# Patient Record
Sex: Female | Born: 1985 | Race: White | Hispanic: No | Marital: Married | Smoking: Never smoker
Health system: Southern US, Community
[De-identification: ages and names within clinical notes are randomized; demographics above are authoritative.]

## PROBLEM LIST (undated history)

## (undated) DIAGNOSIS — D649 Anemia, unspecified: Secondary | ICD-10-CM

## (undated) DIAGNOSIS — K219 Gastro-esophageal reflux disease without esophagitis: Secondary | ICD-10-CM

## (undated) DIAGNOSIS — T8859XA Other complications of anesthesia, initial encounter: Secondary | ICD-10-CM

## (undated) DIAGNOSIS — R569 Unspecified convulsions: Secondary | ICD-10-CM

## (undated) DIAGNOSIS — Z8489 Family history of other specified conditions: Secondary | ICD-10-CM

## (undated) DIAGNOSIS — T4145XA Adverse effect of unspecified anesthetic, initial encounter: Secondary | ICD-10-CM

## (undated) DIAGNOSIS — Z8614 Personal history of Methicillin resistant Staphylococcus aureus infection: Secondary | ICD-10-CM

## (undated) DIAGNOSIS — Z9889 Other specified postprocedural states: Secondary | ICD-10-CM

## (undated) DIAGNOSIS — R112 Nausea with vomiting, unspecified: Secondary | ICD-10-CM

## (undated) DIAGNOSIS — N946 Dysmenorrhea, unspecified: Secondary | ICD-10-CM

## (undated) DIAGNOSIS — M1990 Unspecified osteoarthritis, unspecified site: Secondary | ICD-10-CM

## (undated) HISTORY — PX: REDUCTION MAMMAPLASTY: SUR839

## (undated) HISTORY — PX: BREAST SURGERY: SHX581

## (undated) HISTORY — PX: ABLATION: SHX5711

## (undated) HISTORY — PX: UPPER GI ENDOSCOPY: SHX6162

## (undated) HISTORY — PX: TUBAL LIGATION: SHX77

## (undated) HISTORY — DX: Unspecified osteoarthritis, unspecified site: M19.90

## (undated) HISTORY — DX: Dysmenorrhea, unspecified: N94.6

## (undated) HISTORY — PX: COLONOSCOPY: SHX174

## (undated) HISTORY — PX: SHOULDER SURGERY: SHX246

## (undated) HISTORY — PX: ABDOMINAL HYSTERECTOMY: SHX81

---
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
## 2009-12-01 HISTORY — PX: REDUCTION MAMMAPLASTY: SUR839

## 2013-12-01 HISTORY — PX: TUBAL LIGATION: SHX77

## 2017-08-30 DIAGNOSIS — S43102A Unspecified dislocation of left acromioclavicular joint, initial encounter: Secondary | ICD-10-CM | POA: Insufficient documentation

## 2017-12-01 HISTORY — PX: ABDOMINAL HYSTERECTOMY: SHX81

## 2017-12-01 HISTORY — PX: LAPAROSCOPIC TOTAL HYSTERECTOMY: SUR800

## 2017-12-05 ENCOUNTER — Ambulatory Visit
Admission: EM | Admit: 2017-12-05 | Discharge: 2017-12-05 | Disposition: A | Discharge status: Home / Self Care | Payer: BLUE CROSS/BLUE SHIELD | Attending: Emergency Medicine | Admitting: Emergency Medicine

## 2017-12-05 ENCOUNTER — Encounter: Payer: Self-pay | Admitting: Emergency Medicine

## 2017-12-05 ENCOUNTER — Other Ambulatory Visit: Payer: Self-pay

## 2017-12-05 DIAGNOSIS — J0140 Acute pansinusitis, unspecified: Secondary | ICD-10-CM | POA: Diagnosis not present

## 2017-12-05 MED ORDER — METHYLPREDNISOLONE 4 MG PO TBPK: ORAL_TABLET | ORAL | 0 refills | Status: DC

## 2017-12-05 MED ORDER — NAPROXEN 500 MG PO TABS: 500.0000 mg | ORAL_TABLET | Freq: Two times a day (BID) | ORAL | 0 refills | Status: DC

## 2017-12-05 MED ORDER — FLUTICASONE PROPIONATE 50 MCG/ACT NA SUSP: 2.0000 | Freq: Every day | NASAL | 0 refills | Status: DC

## 2017-12-05 MED ORDER — HYDROCODONE-ACETAMINOPHEN 5-325 MG PO TABS: 1.0000 | ORAL_TABLET | Freq: Four times a day (QID) | ORAL | 0 refills | Status: DC | PRN

## 2017-12-05 NOTE — ED Triage Notes (Signed)
Patient c/o sinus pain and pressure and right sided jaw pain for a week.  Patient currently on Augmentin and started it last night.

## 2017-12-05 NOTE — ED Provider Notes (Signed)
HPI  SUBJECTIVE:  Melanie Choi is a 32 y.o. female who presents with sharp, waxing and waning right upper jaw pain with occasional radiation to the lower jaw for the past 2 days.  She reports feeling feverish, but does not have a thermometer at home.  She reports sinus pain and pressure, postnasal drip.  She states that her upper teeth hurt on the right side.  She also reports right ear pain.  She was seen at another urgent care yesterday, thought to have a sinus infection, started on Augmentin.  She has had 2 doses of the Augmentin thus far.  She has also been taking 2 OTC Aleve's.  Last dose was within 6-8 hours of evaluation.  This is not helping.  Symptoms are worse with chewing, noise.  No nasal congestion, rhinorrhea, trauma to her teeth, caries, cracked tooth.  She denies change in hearing, otorrhea, trismus, neck stiffness.  No popping or clicking of her jaw.  She does grind her teeth.  No history of TMJ athralgia, diabetes, hypertension, otitis media.  LMP: 2 weeks ago.  Denies possibility being pregnant.  PMD: she is establishing care with [facility].    History reviewed. No pertinent past medical history.  Past Surgical History:  Procedure Laterality Date  . ABLATION    . BREAST SURGERY    . CESAREAN SECTION    . SHOULDER SURGERY Right   . TUBAL LIGATION      Family History  Problem Relation Age of Onset  . Thyroid disease Mother   . Heart attack Father     Social History   Tobacco Use  . Smoking status: Never Smoker  . Smokeless tobacco: Never Used  Substance Use Topics  . Alcohol use: No    Frequency: Never  . Drug use: Not on file    No current facility-administered medications for this encounter.   Current Outpatient Medications:  .  amoxicillin-clavulanate (AUGMENTIN) 875-125 MG tablet, Take 1 tablet by mouth 2 (two) times daily., Disp: , Rfl:  .  fluticasone (FLONASE) 50 MCG/ACT nasal spray, Place 2 sprays into both nostrils daily., Disp: 16 g, Rfl:  0 .  HYDROcodone-acetaminophen (NORCO/VICODIN) 5-325 MG tablet, Take 1-2 tablets by mouth every 6 (six) hours as needed for moderate pain or severe pain., Disp: 20 tablet, Rfl: 0 .  methylPREDNISolone (MEDROL DOSEPAK) 4 MG TBPK tablet, Follow package instructions, Disp: 21 tablet, Rfl: 0 .  naproxen (NAPROSYN) 500 MG tablet, Take 1 tablet (500 mg total) by mouth 2 (two) times daily., Disp: 20 tablet, Rfl: 0  Allergies  Allergen Reactions  . Morphine And Related Anaphylaxis     ROS  As noted in HPI.   Physical Exam  BP 122/81 (BP Location: Left Arm)   Pulse 89   Temp 98.5 F (36.9 C) (Oral)   Resp 14   Ht 5\' 5"  (1.651 m)   Wt 177 lb (80.3 kg)   LMP 11/14/2017 (Approximate)   SpO2 100%   BMI 29.45 kg/m   Constitutional: Well developed, well nourished, appears uncomfortable.   Eyes:  EOMI, conjunctiva normal bilaterally HENT: Normocephalic, atraumatic,mucus membranes moist.  Positive clear rhinorrhea with erythematous, swollen turbinates.  Positive maxillary and frontal sinus tenderness, exquisite tenderness over the right maxillary sinus.  Right external ear normal.  No pain with traction on the pinna or palpation of tragus.  No mastoid tenderness.  TM normal.  Mild tenderness over the right TMJ, no crepitus.  Left TM normal.  Normal dentition, teeth  nontender upper and lower jaw.  No gingival swelling or tenderness.  Oropharynx unremarkable. Respiratory: Normal inspiratory effort Cardiovascular: Normal rate GI: nondistended skin: No rash, skin intact Musculoskeletal: no deformities Neurologic: Alert & oriented x 3, no focal neuro deficits Psychiatric: Speech and behavior appropriate   ED Course   Medications - No data to display  No orders of the defined types were placed in this encounter.   No results found for this or any previous visit (from the past 24 hour(s)). No results found.  ED Clinical Impression  Acute non-recurrent pansinusitis   ED  Assessment/Plan  [place] Narcotic database reviewed for this patient, and feel that the risk/benefit ratio today is favorable for proceeding with a prescription for controlled substance.  Presentation most consistent with a sinusitis although TMJ arthralgia is in the differential because she does grind her teeth..  She has only had 2 doses of the Augmentin, so advised her to continue this for another 48 hours a for calling this antibiotic treatment failure. Will start her on Flonase, Medrol Dosepak, Naprosyn 500 mg twice a day to take with 1 g of Tylenol.  Advised her that she may take an additional gram of Tylenol 2 more times a day.  We will sent home with Norco for severe pain  only.  Discussed with her to not take the Norco and the Tylenol together.  She will return here in 48 hours if not better, she will go to the ER if she gets worse.  Patient states she is allergic to morphine IV only, that she has tolerated narcotic pain pills previously without any problems.  Discussed  MDM, plan and followup with patient. Discussed sn/sx that should prompt return to the ED. patient agrees with plan.   Meds ordered this encounter  Medications  . fluticasone (FLONASE) 50 MCG/ACT nasal spray    Sig: Place 2 sprays into both nostrils daily.    Dispense:  16 g    Refill:  0  . methylPREDNISolone (MEDROL DOSEPAK) 4 MG TBPK tablet    Sig: Follow package instructions    Dispense:  21 tablet    Refill:  0  . HYDROcodone-acetaminophen (NORCO/VICODIN) 5-325 MG tablet    Sig: Take 1-2 tablets by mouth every 6 (six) hours as needed for moderate pain or severe pain.    Dispense:  20 tablet    Refill:  0  . naproxen (NAPROSYN) 500 MG tablet    Sig: Take 1 tablet (500 mg total) by mouth 2 (two) times daily.    Dispense:  20 tablet    Refill:  0    *This clinic note was created using [employer]. Therefore, there may be occasional mistakes despite careful proofreading.   ?   Melynda Ripple,  MD 12/05/17 1109

## 2017-12-10 ENCOUNTER — Encounter: Payer: Self-pay | Admitting: Obstetrics and Gynecology

## 2017-12-10 ENCOUNTER — Ambulatory Visit (INDEPENDENT_AMBULATORY_CARE_PROVIDER_SITE_OTHER): Payer: BLUE CROSS/BLUE SHIELD | Admitting: Obstetrics and Gynecology

## 2017-12-10 VITALS — BP 111/75 | HR 80 | Ht 65.0 in | Wt 175.1 lb

## 2017-12-10 DIAGNOSIS — N9089 Other specified noninflammatory disorders of vulva and perineum: Secondary | ICD-10-CM | POA: Diagnosis not present

## 2017-12-10 DIAGNOSIS — N920 Excessive and frequent menstruation with regular cycle: Secondary | ICD-10-CM

## 2017-12-10 MED ORDER — CLOBETASOL PROPIONATE 0.05 % EX CREA: 1.0000 "application " | TOPICAL_CREAM | Freq: Two times a day (BID) | CUTANEOUS | 2 refills | Status: AC

## 2017-12-10 NOTE — Progress Notes (Addendum)
HPI:      [name] is a 32 y.o. [id number] who LMP was Patient's last menstrual period was 11/17/2017.  Subjective:   She presents today because she has just moved to this area and would like to establish GYN care.  She has had 2 prior cesarean deliveries and a tubal ligation.  She has been experiencing extremely heavy menstrual bleeding for her entire life.  She states her periods last 10-14 days and every day is heavy.  She previously tried OCPs without success.  She underwent endometrial ablation which she says "did not work".  She continues to experience very heavy prolonged menses.  She states her last GYN felt she had adenomyosis and had suggested hysterectomy when ablation failed.  Patient is interested in scheduling a hysterectomy. Additionally she complains of intermittent labial tears which occurs spontaneously.  Significant burning especially with urination at this labial skin area.  She denies history of HSV or any previous issue with blisters.    Hx: The following portions of the patient's history were reviewed and updated as appropriate:             She  has a past medical history of Arthritis and Dysmenorrhea. She does not have a problem list on file. She  has a past surgical history that includes Tubal ligation; Cesarean section; Ablation; Shoulder surgery (Right); and Breast surgery. Her family history includes Heart attack in her father; Thyroid disease in her mother. She  reports that  has never smoked. she has never used smokeless tobacco. She reports that she does not drink alcohol or use drugs. She is allergic to morphine and related.       Review of Systems:  Review of Systems  Constitutional: Denied constitutional symptoms, night sweats, recent illness, fatigue, fever, insomnia and weight loss.  Eyes: Denied eye symptoms, eye pain, photophobia, vision change and visual disturbance.  Ears/Nose/Throat/Neck: Denied ear, nose, throat or neck symptoms, hearing loss,  nasal discharge, sinus congestion and sore throat.  Cardiovascular: Denied cardiovascular symptoms, arrhythmia, chest pain/pressure, edema, exercise intolerance, orthopnea and palpitations.  Respiratory: Denied pulmonary symptoms, asthma, pleuritic pain, productive sputum, cough, dyspnea and wheezing.  Gastrointestinal: Denied, gastro-esophageal reflux, melena, nausea and vomiting.  Genitourinary: See HPI for additional information.  Musculoskeletal: Denied musculoskeletal symptoms, stiffness, swelling, muscle weakness and myalgia.  Dermatologic: Denied dermatology symptoms, rash and scar.  Neurologic: Denied neurology symptoms, dizziness, headache, neck pain and syncope.  Psychiatric: Denied psychiatric symptoms, anxiety and depression.  Endocrine: Denied endocrine symptoms including hot flashes and night sweats.   Meds:   Current Outpatient Medications on File Prior to Visit  Medication Sig Dispense Refill  . amoxicillin-clavulanate (AUGMENTIN) 875-125 MG tablet Take 1 tablet by mouth 2 (two) times daily.    . fluticasone (FLONASE) 50 MCG/ACT nasal spray Place 2 sprays into both nostrils daily. 16 g 0  . HYDROcodone-acetaminophen (NORCO/VICODIN) 5-325 MG tablet Take 1-2 tablets by mouth every 6 (six) hours as needed for moderate pain or severe pain. 20 tablet 0  . naproxen (NAPROSYN) 500 MG tablet Take 1 tablet (500 mg total) by mouth 2 (two) times daily. 20 tablet 0   No current facility-administered medications on file prior to visit.     Objective:     Vitals:   12/10/17 1106  BP: 111/75  Pulse: 80              Physical examination   Pelvic:   Vulva:  Linear tear along the labial majora left.  Some agglutination of fold noted.  Similar appearance to the right although not currently torn.  Appearance of possible localized lichen sclerosis.  Vagina: No lesions or abnormalities noted.  Support: Normal pelvic support.  Urethra No masses tenderness or scarring.  Meatus Normal  size without lesions or prolapse.  Cervix: Normal appearance.  No lesions.  Anus: Normal exam.  No lesions.  Perineum: Normal exam.  No lesions.        Bimanual   Uterus:  Top normal.  Non-tender.  Mobile.  AV.  Adnexae: No masses.  Non-tender to palpation.  Cul-de-sac: Negative for abnormality.     Assessment:    G2P2002 There are no active problems to display for this patient.    1. Vulvar lesion   2. Menorrhagia with regular cycle     Possible lichen sclerosis causing vulvar tearing  Adenomyosis and possible endometriosis seem likely.  Patient desires definitive surgery.   Plan:            1.  Clobetasol to vulva as directed.  2.  Patient to schedule preop appointment when she has chosen a date for surgery.  We have discussed different forms of surgery including LAVH and TAH.  Oophorectomy has also been briefly discussed. Orders No orders of the defined types were placed in this encounter.    Meds ordered this encounter  Medications  . clobetasol cream (TEMOVATE) 0.05 %    Sig: Apply 1 application topically 2 (two) times daily. Use for 30 days twice a day as directed then use daily as directed.    Dispense:  60 g    Refill:  2      F/U  No Follow-up on file.  Finis Bud, M.D. 12/10/2017 12:14 PM

## 2018-01-12 DIAGNOSIS — E782 Mixed hyperlipidemia: Secondary | ICD-10-CM | POA: Insufficient documentation

## 2018-01-12 DIAGNOSIS — M546 Pain in thoracic spine: Secondary | ICD-10-CM | POA: Insufficient documentation

## 2018-01-12 DIAGNOSIS — E7800 Pure hypercholesterolemia, unspecified: Secondary | ICD-10-CM | POA: Insufficient documentation

## 2018-01-12 DIAGNOSIS — Z8711 Personal history of peptic ulcer disease: Secondary | ICD-10-CM | POA: Insufficient documentation

## 2018-01-12 DIAGNOSIS — G8929 Other chronic pain: Secondary | ICD-10-CM | POA: Insufficient documentation

## 2018-01-12 DIAGNOSIS — K219 Gastro-esophageal reflux disease without esophagitis: Secondary | ICD-10-CM | POA: Insufficient documentation

## 2018-01-12 DIAGNOSIS — M545 Low back pain, unspecified: Secondary | ICD-10-CM | POA: Insufficient documentation

## 2018-01-26 ENCOUNTER — Encounter: Payer: Self-pay | Admitting: Obstetrics and Gynecology

## 2018-01-26 ENCOUNTER — Ambulatory Visit (INDEPENDENT_AMBULATORY_CARE_PROVIDER_SITE_OTHER): Payer: BLUE CROSS/BLUE SHIELD | Admitting: Obstetrics and Gynecology

## 2018-01-26 VITALS — BP 138/85 | HR 83 | Ht 65.0 in | Wt 171.2 lb

## 2018-01-26 DIAGNOSIS — N920 Excessive and frequent menstruation with regular cycle: Secondary | ICD-10-CM

## 2018-01-26 DIAGNOSIS — Z01818 Encounter for other preprocedural examination: Secondary | ICD-10-CM | POA: Diagnosis not present

## 2018-01-26 MED ORDER — METRONIDAZOLE 500 MG PO TABS: 500.0000 mg | ORAL_TABLET | Freq: Two times a day (BID) | ORAL | 0 refills | Status: DC

## 2018-01-26 NOTE — H&P (Signed)
PRE-OPERATIVE HISTORY AND PHYSICAL EXAM  PCP:  Shari Prows, [facility] Subjective:   HPI:  Melanie Choi is a 32 y.o. [id number].  Patient's last menstrual period was 01/05/2018.  She presents today for a pre-op discussion and PE.  She has the following symptoms: Heavy menstrual bleeding with cramps.  Previously underwent endometrial ablation which was unsuccessful.  The patient also experiences pelvic pain not associated with menses. History is significant for prior cesarean delivery.  Review of Systems:   Constitutional: Denied constitutional symptoms, night sweats, recent illness, fatigue, fever, insomnia and weight loss.  Eyes: Denied eye symptoms, eye pain, photophobia, vision change and visual disturbance.  Ears/Nose/Throat/Neck: Denied ear, nose, throat or neck symptoms, hearing loss, nasal discharge, sinus congestion and sore throat.  Cardiovascular: Denied cardiovascular symptoms, arrhythmia, chest pain/pressure, edema, exercise intolerance, orthopnea and palpitations.  Respiratory: Denied pulmonary symptoms, asthma, pleuritic pain, productive sputum, cough, dyspnea and wheezing.  Gastrointestinal: Denied, gastro-esophageal reflux, melena, nausea and vomiting.  Genitourinary: See HPI for additional information.  Musculoskeletal: Denied musculoskeletal symptoms, stiffness, swelling, muscle weakness and myalgia.  Dermatologic: Denied dermatology symptoms, rash and scar.  Neurologic: Denied neurology symptoms, dizziness, headache, neck pain and syncope.  Psychiatric: Denied psychiatric symptoms, anxiety and depression.  Endocrine: Denied endocrine symptoms including hot flashes and night sweats.   OB History  Gravida Para Term Preterm AB Living  2 2 2     2   SAB TAB Ectopic Multiple Live Births          2    # Outcome Date GA Lbr Len/2nd Weight Sex Delivery Anes PTL Lv  2 Term 2015   6 lb (2.722 kg) F CS-Unspec  Y LIV  1 Term 2013   7 lb 14 oz (3.572 kg) M CS-Unspec  Y  LIV      Past Medical History:  Diagnosis Date  . Arthritis   . Dysmenorrhea     Past Surgical History:  Procedure Laterality Date  . ABLATION    . BREAST SURGERY    . CESAREAN SECTION    . SHOULDER SURGERY Right   . TUBAL LIGATION        SOCIAL HISTORY: Social History   Tobacco Use  Smoking Status Never Smoker  Smokeless Tobacco Never Used   Social History   Substance and Sexual Activity  Alcohol Use No  . Frequency: Never   Social History   Substance and Sexual Activity  Drug Use No    Family History  Problem Relation Age of Onset  . Thyroid disease Mother   . Heart attack Father     ALLERGIES:  Morphine and related  MEDS:   Current Outpatient Medications on File Prior to Visit  Medication Sig Dispense Refill  . naproxen (NAPROSYN) 500 MG tablet Take 1 tablet (500 mg total) by mouth 2 (two) times daily. 20 tablet 0  . fluticasone (FLONASE) 50 MCG/ACT nasal spray Place 2 sprays into both nostrils daily. (Patient not taking: Reported on 01/26/2018) 16 g 0   No current facility-administered medications on file prior to visit.     No orders of the defined types were placed in this encounter.    Physical examination BP 138/85   Pulse 83   Ht 5\' 5"  (1.651 m)   Wt 171 lb 3.2 oz (77.7 kg)   LMP 01/05/2018   BMI 28.49 kg/m   General NAD, Conversant  HEENT Atraumatic; Op clear with mmm.  Normo-cephalic. Pupils reactive. Anicteric sclerae  Thyroid/Neck Smooth without nodularity or enlargement. Normal ROM.  Neck Supple.  Skin No rashes, lesions or ulceration. Normal palpated skin turgor. No nodularity.  Breasts: No masses or discharge.  Symmetric.  No axillary adenopathy.  Previous breast reduction surgery scars present.  Lungs: Clear to auscultation.No rales or wheezes. Normal Respiratory effort, no retractions.  Heart: NSR.  No murmurs or rubs appreciated. No periferal edema  Abdomen: Soft.  Non-tender.  No masses.  No HSM. No hernia  Extremities:  Moves all appropriately.  Normal ROM for age. No lymphadenopathy.  Neuro: Oriented to PPT.  Normal mood. Normal affect.     Pelvic:   Vulva: Normal appearance.  No lesions.  Vagina: No lesions or abnormalities noted.  Support: Normal pelvic support.  Urethra No masses tenderness or scarring.  Meatus Normal size without lesions or prolapse.  Cervix: Normal ectropion.  No lesions.  Anus: Normal exam.  No lesions.  Perineum: Normal exam.  No lesions.        Bimanual   Uterus:  Approximately 8 weeks size.  Non-tender.  Mobile.  AV.  Adnexae: No masses.  Non-tender to palpation.  Cul-de-sac: Negative for abnormality.   Assessment:   G2P2002 There are no active problems to display for this patient.   1. Preop examination   2. Menorrhagia with regular cycle     Patient having significant menorrhagia.  Also experiences pelvic pain not associated with cycle.  Has occasional dyspareunia as well.  Has failed hormone therapy.  Has failed endometrial ablation.  We have discussed the possibility of adenomyosis and endometriosis in detail although she has not been previously diagnosed with this.   Patient has considered her options and would like definitive surgery.  Plan:   Orders: No orders of the defined types were placed in this encounter.    1.  laparoscopic assisted vaginal hysterectomy  Possible BSO

## 2018-01-26 NOTE — Progress Notes (Signed)
PRE-OPERATIVE HISTORY AND PHYSICAL EXAM  PCP:  Shari Prows, [facility] Subjective:   HPI:  Melanie Choi is a 32 y.o. [id number].  Patient's last menstrual period was 01/05/2018.  She presents today for a pre-op discussion and PE.  She has the following symptoms: Heavy menstrual bleeding with cramps.  Previously underwent endometrial ablation which was unsuccessful.  The patient also experiences pelvic pain not associated with menses. History is significant for prior cesarean delivery.  Review of Systems:   Constitutional: Denied constitutional symptoms, night sweats, recent illness, fatigue, fever, insomnia and weight loss.  Eyes: Denied eye symptoms, eye pain, photophobia, vision change and visual disturbance.  Ears/Nose/Throat/Neck: Denied ear, nose, throat or neck symptoms, hearing loss, nasal discharge, sinus congestion and sore throat.  Cardiovascular: Denied cardiovascular symptoms, arrhythmia, chest pain/pressure, edema, exercise intolerance, orthopnea and palpitations.  Respiratory: Denied pulmonary symptoms, asthma, pleuritic pain, productive sputum, cough, dyspnea and wheezing.  Gastrointestinal: Denied, gastro-esophageal reflux, melena, nausea and vomiting.  Genitourinary: See HPI for additional information.  Musculoskeletal: Denied musculoskeletal symptoms, stiffness, swelling, muscle weakness and myalgia.  Dermatologic: Denied dermatology symptoms, rash and scar.  Neurologic: Denied neurology symptoms, dizziness, headache, neck pain and syncope.  Psychiatric: Denied psychiatric symptoms, anxiety and depression.  Endocrine: Denied endocrine symptoms including hot flashes and night sweats.   OB History  Gravida Para Term Preterm AB Living  2 2 2     2   SAB TAB Ectopic Multiple Live Births          2    # Outcome Date GA Lbr Len/2nd Weight Sex Delivery Anes PTL Lv  2 Term 2015   6 lb (2.722 kg) F CS-Unspec  Y LIV  1 Term 2013   7 lb 14 oz (3.572 kg) M CS-Unspec  Y  LIV      Past Medical History:  Diagnosis Date  . Arthritis   . Dysmenorrhea     Past Surgical History:  Procedure Laterality Date  . ABLATION    . BREAST SURGERY    . CESAREAN SECTION    . SHOULDER SURGERY Right   . TUBAL LIGATION        SOCIAL HISTORY: Social History   Tobacco Use  Smoking Status Never Smoker  Smokeless Tobacco Never Used   Social History   Substance and Sexual Activity  Alcohol Use No  . Frequency: Never   Social History   Substance and Sexual Activity  Drug Use No    Family History  Problem Relation Age of Onset  . Thyroid disease Mother   . Heart attack Father     ALLERGIES:  Morphine and related  MEDS:   Current Outpatient Medications on File Prior to Visit  Medication Sig Dispense Refill  . naproxen (NAPROSYN) 500 MG tablet Take 1 tablet (500 mg total) by mouth 2 (two) times daily. 20 tablet 0  . fluticasone (FLONASE) 50 MCG/ACT nasal spray Place 2 sprays into both nostrils daily. (Patient not taking: Reported on 01/26/2018) 16 g 0   No current facility-administered medications on file prior to visit.     No orders of the defined types were placed in this encounter.    Physical examination BP 138/85   Pulse 83   Ht 5\' 5"  (1.651 m)   Wt 171 lb 3.2 oz (77.7 kg)   LMP 01/05/2018   BMI 28.49 kg/m   General NAD, Conversant  HEENT Atraumatic; Op clear with mmm.  Normo-cephalic. Pupils reactive. Anicteric sclerae  Thyroid/Neck Smooth without nodularity or enlargement. Normal ROM.  Neck Supple.  Skin No rashes, lesions or ulceration. Normal palpated skin turgor. No nodularity.  Breasts: No masses or discharge.  Symmetric.  No axillary adenopathy.  Previous breast reduction surgery scars present.  Lungs: Clear to auscultation.No rales or wheezes. Normal Respiratory effort, no retractions.  Heart: NSR.  No murmurs or rubs appreciated. No periferal edema  Abdomen: Soft.  Non-tender.  No masses.  No HSM. No hernia  Extremities:  Moves all appropriately.  Normal ROM for age. No lymphadenopathy.  Neuro: Oriented to PPT.  Normal mood. Normal affect.     Pelvic:   Vulva: Normal appearance.  No lesions.  Vagina: No lesions or abnormalities noted.  Support: Normal pelvic support.  Urethra No masses tenderness or scarring.  Meatus Normal size without lesions or prolapse.  Cervix: Normal ectropion.  No lesions.  Anus: Normal exam.  No lesions.  Perineum: Normal exam.  No lesions.        Bimanual   Uterus:  Approximately 8 weeks size.  Non-tender.  Mobile.  AV.  Adnexae: No masses.  Non-tender to palpation.  Cul-de-sac: Negative for abnormality.   Assessment:   G2P2002 There are no active problems to display for this patient.   1. Preop examination   2. Menorrhagia with regular cycle     Patient having significant menorrhagia.  Also experiences pelvic pain not associated with cycle.  Has occasional dyspareunia as well.  Has failed hormone therapy.  Has failed endometrial ablation.  We have discussed the possibility of adenomyosis and endometriosis in detail although she has not been previously diagnosed with this.   Patient has considered her options and would like definitive surgery.  Plan:   Orders: No orders of the defined types were placed in this encounter.    1.  laparoscopic assisted vaginal hysterectomy  Possible BSO  Pre-op discussions regarding Risks and Benefits of her scheduled surgery.  LAVH The procedure of Laparoscopic Assisted Vaginal Hysterectomy was described to the patient in detail.  We reviewed the rationale for Hysterectomy and the patient was again informed of other nonsurgical management possibilities for her condition.  She has considered these other options, and desires a Hysterectomy.  We have reviewed the fact that Hysterectomy is permanent and that following the procedure she will not be able to become pregnant or bear children.  We have discussed the following risk factors  specifically and the patient has also been informed that additional complications not mentioned may develop:  Damage to bowel, bladder, ureters or to other internal organs, bleeding, infection and the risk from anesthesia.  We have discussed the procedure itself in detail and she has an informed understanding of this surgery.  We have also discussed the recovery period in which physical and sexual activity will be restricted for a varying degree of time, often 3 - 6 weeks. The Laparoscopic Portion of Hysterectomy has also been reviewed with the patient.  She understands how the laparoscope facilitates the procedure.  We have discussed the abdominal incisions and punctures that will be used.  We have also reviewed the increased Operating Room time often accompanying LAVH.  The slightly increased risk of complications secondary to abdominal punctures, and use of laparoscopic instrumentation has also been discussed in detail.I have answered all of her questions and I believe the patient has an informed understanding of the procedure of Laparoscopic Assisted Vaginal Hysterectomy. Oophorectomy The option of Oophorectomy has been discussed with the patient.  Detailed  risk/benefits have been reviewed.  The risks discussed include, but are not limited to, hemorrhage, infection, damage to ureter or other internal organ, and Ovarian Remnant Syndrome.  The benefits include a significant decrease in the risk of Ovarian Cancer and in benign Ovarian disease.  The risk of Ovarian CA has been estimated at 1 in 36.  This is a relatively small risk.  However, should Ovarian CA develop, it is often found late in the course of the disease.  We have also discussed the role of inheritance in the development of Ovarian disease.  Some women, who have close relatives with Ovarian CA, have a higher than 1 in 70 risk of Ovarian CA.  The benefits of Estrogen replacement therapy following Oophorectomy has been stressed.  If she is  premenopausal, we have discussed the fact that this procedure will make her permanently sterile and that premature menopause will result if no ERT is begun.  I have answered all of her questions, and I believe that she has an adequate and informed understanding of the risks and benefits of Oophorectomy. Patient would like to keep her ovaries unless they are abnormal with adhesions or significant endometriosis is present.   Finis Bud, M.D. 01/26/2018 10:45 AM

## 2018-01-26 NOTE — H&P (View-Only) (Signed)
PRE-OPERATIVE HISTORY AND PHYSICAL EXAM  PCP:  Shari Prows, [facility] Subjective:   HPI:  Melanie Choi is a 32 y.o. [id number].  Patient's last menstrual period was 01/05/2018.  She presents today for a pre-op discussion and PE.  She has the following symptoms: Heavy menstrual bleeding with cramps.  Previously underwent endometrial ablation which was unsuccessful.  The patient also experiences pelvic pain not associated with menses. History is significant for prior cesarean delivery.  Review of Systems:   Constitutional: Denied constitutional symptoms, night sweats, recent illness, fatigue, fever, insomnia and weight loss.  Eyes: Denied eye symptoms, eye pain, photophobia, vision change and visual disturbance.  Ears/Nose/Throat/Neck: Denied ear, nose, throat or neck symptoms, hearing loss, nasal discharge, sinus congestion and sore throat.  Cardiovascular: Denied cardiovascular symptoms, arrhythmia, chest pain/pressure, edema, exercise intolerance, orthopnea and palpitations.  Respiratory: Denied pulmonary symptoms, asthma, pleuritic pain, productive sputum, cough, dyspnea and wheezing.  Gastrointestinal: Denied, gastro-esophageal reflux, melena, nausea and vomiting.  Genitourinary: See HPI for additional information.  Musculoskeletal: Denied musculoskeletal symptoms, stiffness, swelling, muscle weakness and myalgia.  Dermatologic: Denied dermatology symptoms, rash and scar.  Neurologic: Denied neurology symptoms, dizziness, headache, neck pain and syncope.  Psychiatric: Denied psychiatric symptoms, anxiety and depression.  Endocrine: Denied endocrine symptoms including hot flashes and night sweats.   OB History  Gravida Para Term Preterm AB Living  2 2 2     2   SAB TAB Ectopic Multiple Live Births          2    # Outcome Date GA Lbr Len/2nd Weight Sex Delivery Anes PTL Lv  2 Term 2015   6 lb (2.722 kg) F CS-Unspec  Y LIV  1 Term 2013   7 lb 14 oz (3.572 kg) M CS-Unspec  Y  LIV      Past Medical History:  Diagnosis Date  . Arthritis   . Dysmenorrhea     Past Surgical History:  Procedure Laterality Date  . ABLATION    . BREAST SURGERY    . CESAREAN SECTION    . SHOULDER SURGERY Right   . TUBAL LIGATION        SOCIAL HISTORY: Social History   Tobacco Use  Smoking Status Never Smoker  Smokeless Tobacco Never Used   Social History   Substance and Sexual Activity  Alcohol Use No  . Frequency: Never   Social History   Substance and Sexual Activity  Drug Use No    Family History  Problem Relation Age of Onset  . Thyroid disease Mother   . Heart attack Father     ALLERGIES:  Morphine and related  MEDS:   Current Outpatient Medications on File Prior to Visit  Medication Sig Dispense Refill  . naproxen (NAPROSYN) 500 MG tablet Take 1 tablet (500 mg total) by mouth 2 (two) times daily. 20 tablet 0  . fluticasone (FLONASE) 50 MCG/ACT nasal spray Place 2 sprays into both nostrils daily. (Patient not taking: Reported on 01/26/2018) 16 g 0   No current facility-administered medications on file prior to visit.     No orders of the defined types were placed in this encounter.    Physical examination BP 138/85   Pulse 83   Ht 5\' 5"  (1.651 m)   Wt 171 lb 3.2 oz (77.7 kg)   LMP 01/05/2018   BMI 28.49 kg/m   General NAD, Conversant  HEENT Atraumatic; Op clear with mmm.  Normo-cephalic. Pupils reactive. Anicteric sclerae  Thyroid/Neck Smooth without nodularity or enlargement. Normal ROM.  Neck Supple.  Skin No rashes, lesions or ulceration. Normal palpated skin turgor. No nodularity.  Breasts: No masses or discharge.  Symmetric.  No axillary adenopathy.  Previous breast reduction surgery scars present.  Lungs: Clear to auscultation.No rales or wheezes. Normal Respiratory effort, no retractions.  Heart: NSR.  No murmurs or rubs appreciated. No periferal edema  Abdomen: Soft.  Non-tender.  No masses.  No HSM. No hernia  Extremities:  Moves all appropriately.  Normal ROM for age. No lymphadenopathy.  Neuro: Oriented to PPT.  Normal mood. Normal affect.     Pelvic:   Vulva: Normal appearance.  No lesions.  Vagina: No lesions or abnormalities noted.  Support: Normal pelvic support.  Urethra No masses tenderness or scarring.  Meatus Normal size without lesions or prolapse.  Cervix: Normal ectropion.  No lesions.  Anus: Normal exam.  No lesions.  Perineum: Normal exam.  No lesions.        Bimanual   Uterus:  Approximately 8 weeks size.  Non-tender.  Mobile.  AV.  Adnexae: No masses.  Non-tender to palpation.  Cul-de-sac: Negative for abnormality.   Assessment:   G2P2002 There are no active problems to display for this patient.   1. Preop examination   2. Menorrhagia with regular cycle     Patient having significant menorrhagia.  Also experiences pelvic pain not associated with cycle.  Has occasional dyspareunia as well.  Has failed hormone therapy.  Has failed endometrial ablation.  We have discussed the possibility of adenomyosis and endometriosis in detail although she has not been previously diagnosed with this.   Patient has considered her options and would like definitive surgery.  Plan:   Orders: No orders of the defined types were placed in this encounter.    1.  laparoscopic assisted vaginal hysterectomy  Possible BSO

## 2018-02-01 ENCOUNTER — Telehealth: Payer: Self-pay | Admitting: Obstetrics and Gynecology

## 2018-02-01 NOTE — Telephone Encounter (Signed)
The patient called and stated that her prescription for a cream was declined because she didn't try another cream first. The patient would like to speak with a nurse. Please advise.

## 2018-02-01 NOTE — Telephone Encounter (Signed)
Pt states her insurance wants her to  try another medication and fail before they pay for the temovate. Pt will call back with medication name. Will erx.

## 2018-02-08 ENCOUNTER — Encounter
Admission: RE | Admit: 2018-02-08 | Discharge: 2018-02-08 | Disposition: A | Discharge status: Home / Self Care | Payer: BLUE CROSS/BLUE SHIELD | Source: Ambulatory Visit | Attending: Obstetrics and Gynecology | Admitting: Obstetrics and Gynecology

## 2018-02-08 ENCOUNTER — Other Ambulatory Visit: Payer: Self-pay

## 2018-02-08 HISTORY — DX: Personal history of Methicillin resistant Staphylococcus aureus infection: Z86.14

## 2018-02-08 HISTORY — DX: Gastro-esophageal reflux disease without esophagitis: K21.9

## 2018-02-08 HISTORY — DX: Unspecified convulsions: R56.9

## 2018-02-08 HISTORY — DX: Other specified postprocedural states: Z98.890

## 2018-02-08 HISTORY — DX: Other specified postprocedural states: R11.2

## 2018-02-08 HISTORY — DX: Family history of other specified conditions: Z84.89

## 2018-02-08 HISTORY — DX: Anemia, unspecified: D64.9

## 2018-02-08 HISTORY — DX: Adverse effect of unspecified anesthetic, initial encounter: T41.45XA

## 2018-02-08 HISTORY — DX: Other complications of anesthesia, initial encounter: T88.59XA

## 2018-02-08 NOTE — Patient Instructions (Signed)
Your procedure is scheduled on: 02-15-18 MONDAY Report to Same Day Surgery 2nd floor medical mall [facility] Entrance-take elevator on left to 2nd floor.  Check in with surgery information desk.) To find out your arrival time please call [phone] between 1PM - 3PM on 02-12-18 FRIDAY  Remember: Instructions that are not followed completely may result in serious medical risk, up to and including death, or upon the discretion of your surgeon and anesthesiologist your surgery may need to be rescheduled.    _x___ 1. Do not eat food after midnight the night before your procedure. NO GUM OR CANDY AFTER MIDNIGHT.  You may drink clear liquids up to 2 hours before you are scheduled to arrive at the hospital for your procedure.  Do not drink clear liquids within 2 hours of your scheduled arrival to the hospital.  Clear liquids include  --Water or Apple juice without pulp  --Clear carbohydrate beverage such as ClearFast or Gatorade  --Black Coffee or Clear Tea (No milk, no creamers, do not add anything to the coffee or Tea    __x__ 2. No Alcohol for 24 hours before or after surgery.   __x__3. No Smoking or e-cigarettes for 24 prior to surgery.  Do not use any chewable tobacco products for at least 6 hour prior to surgery   ____  4. Bring all medications with you on the day of surgery if instructed.    __x__ 5. Notify your doctor if there is any change in your medical condition     (cold, fever, infections).    x___6. On the morning of surgery brush your teeth with toothpaste and water.  You may rinse your mouth with mouth wash if you wish.  Do not swallow any toothpaste or mouthwash.   Do not wear jewelry, make-up, hairpins, clips or nail polish.  Do not wear lotions, powders, or perfumes. You may wear deodorant.  Do not shave 48 hours prior to surgery. Men may shave face and neck.  Do not bring valuables to the hospital.    [facility] is not responsible for any belongings or  valuables.               Contacts, dentures or bridgework may not be worn into surgery.  Leave your suitcase in the car. After surgery it may be brought to your room.  For patients admitted to the hospital, discharge time is determined by your treatment team.  _  Patients discharged the day of surgery will not be allowed to drive home.  You will need someone to drive you home and stay with you the night of your procedure.    Please read over the following fact sheets that you were given:   [facility] Preparing for Surgery and or MRSA Information   _x___ TAKE THE FOLLOWING MEDICATION THE MORNING OF SURGERY WITH A SMALL SIP OF WATER. These include:  1. RANITIDINE   2. TAKE A RANITIDINE THE NIGHT BEFORE SURGERY  3.  4.  5.  6.  ____Fleets enema or Magnesium Citrate as directed.   _x___ Use CHG Soap or sage wipes as directed on instruction sheet   ____ Use inhalers on the day of surgery and bring to hospital day of surgery  ____ Stop Metformin and Janumet 2 days prior to surgery.    ____ Take 1/2 of usual insulin dose the night before surgery and none on the morning surgery.   ____ Follow recommendations from Cardiologist, Pulmonologist or PCP regarding stopping Aspirin,  Coumadin, Plavix ,Eliquis, Effient, or Pradaxa, and Pletal.  X____Stop Anti-inflammatories such as Advil, Aleve, Ibuprofen, Motrin, Naproxen, Naprosyn, Goodies powders or aspirin products NOW-OK to take Tylenol    ____ Stop supplements until after surgery.   ____ Bring C-Pap to the hospital.

## 2018-02-09 ENCOUNTER — Inpatient Hospital Stay: Admission: RE | Admit: 2018-02-09 | Payer: BLUE CROSS/BLUE SHIELD | Source: Ambulatory Visit

## 2018-02-10 ENCOUNTER — Encounter
Admission: RE | Admit: 2018-02-10 | Discharge: 2018-02-10 | Disposition: A | Discharge status: Home / Self Care | Payer: BLUE CROSS/BLUE SHIELD | Source: Ambulatory Visit | Attending: Obstetrics and Gynecology | Admitting: Obstetrics and Gynecology

## 2018-02-10 ENCOUNTER — Telehealth: Payer: Self-pay | Admitting: Obstetrics and Gynecology

## 2018-02-10 DIAGNOSIS — Z01812 Encounter for preprocedural laboratory examination: Secondary | ICD-10-CM | POA: Insufficient documentation

## 2018-02-10 LAB — CBC
HCT: 41 % (ref 35.0–47.0)
Hemoglobin: 13.8 g/dL (ref 12.0–16.0)
MCH: 32.4 pg (ref 26.0–34.0)
MCHC: 33.6 g/dL (ref 32.0–36.0)
MCV: 96.4 fL (ref 80.0–100.0)
PLATELETS: 238 10*3/uL (ref 150–440)
RBC: 4.26 MIL/uL (ref 3.80–5.20)
RDW: 12.8 % (ref 11.5–14.5)
WBC: 7.1 10*3/uL (ref 3.6–11.0)

## 2018-02-10 LAB — TYPE AND SCREEN
ABO/RH(D): O POS
ANTIBODY SCREEN: NEGATIVE

## 2018-02-10 LAB — SURGICAL PCR SCREEN
MRSA, PCR: NEGATIVE
STAPHYLOCOCCUS AUREUS: POSITIVE — AB

## 2018-02-10 LAB — HCG, QUANTITATIVE, PREGNANCY: hCG, Beta Chain, Quant, S: <1 m[IU]/mL (ref <5)

## 2018-02-10 NOTE — Telephone Encounter (Signed)
The patient called and stated that she would like to speak with a her nurse in regards to her upcoming procedure. The patient would like to know if

## 2018-02-14 MED ORDER — CEFAZOLIN SODIUM-DEXTROSE 2-4 GM/100ML-% IV SOLN
2.0000 g | INTRAVENOUS | Status: AC
  Administered 2018-02-15: 2 g via INTRAVENOUS

## 2018-02-15 ENCOUNTER — Observation Stay
Admission: RE | Admit: 2018-02-15 | Discharge: 2018-02-16 | Disposition: A | Discharge status: Home / Self Care | Payer: BLUE CROSS/BLUE SHIELD | Source: Ambulatory Visit | Attending: Obstetrics and Gynecology | Admitting: Obstetrics and Gynecology

## 2018-02-15 ENCOUNTER — Ambulatory Visit: Payer: BLUE CROSS/BLUE SHIELD | Admitting: Certified Registered Nurse Anesthetist

## 2018-02-15 ENCOUNTER — Encounter: Payer: Self-pay | Admitting: Emergency Medicine

## 2018-02-15 ENCOUNTER — Encounter
Admission: RE | Disposition: A | Discharge status: Home / Self Care | Payer: Self-pay | Source: Ambulatory Visit | Attending: Obstetrics and Gynecology

## 2018-02-15 DIAGNOSIS — N9089 Other specified noninflammatory disorders of vulva and perineum: Secondary | ICD-10-CM | POA: Diagnosis not present

## 2018-02-15 DIAGNOSIS — Z9889 Other specified postprocedural states: Secondary | ICD-10-CM

## 2018-02-15 DIAGNOSIS — N920 Excessive and frequent menstruation with regular cycle: Secondary | ICD-10-CM | POA: Diagnosis present

## 2018-02-15 DIAGNOSIS — N736 Female pelvic peritoneal adhesions (postinfective): Secondary | ICD-10-CM | POA: Diagnosis not present

## 2018-02-15 HISTORY — PX: LAPAROSCOPIC BILATERAL SALPINGECTOMY: SHX5889

## 2018-02-15 HISTORY — PX: LAPAROSCOPIC ASSISTED VAGINAL HYSTERECTOMY: SHX5398

## 2018-02-15 LAB — ABO/RH: ABO/RH(D): O POS

## 2018-02-15 LAB — POCT PREGNANCY, URINE: Preg Test, Ur: NEGATIVE

## 2018-02-15 SURGERY — HYSTERECTOMY, VAGINAL, LAPAROSCOPY-ASSISTED
Anesthesia: General | Wound class: Clean Contaminated

## 2018-02-15 MED ORDER — SIMETHICONE 80 MG PO CHEW
80.0000 mg | CHEWABLE_TABLET | Freq: Four times a day (QID) | ORAL | Status: DC | PRN
  Administered 2018-02-15: 80 mg via ORAL
  Filled 2018-02-15: qty 1

## 2018-02-15 MED ORDER — MIDAZOLAM HCL 2 MG/2ML IJ SOLN
INTRAMUSCULAR | Status: DC | PRN
  Administered 2018-02-15: 2 mg via INTRAVENOUS

## 2018-02-15 MED ORDER — URELLE 81 MG PO TABS
1.0000 | ORAL_TABLET | Freq: Four times a day (QID) | ORAL | Status: DC | PRN
  Filled 2018-02-15: qty 1

## 2018-02-15 MED ORDER — BELLADONNA ALKALOIDS-OPIUM 16.2-60 MG RE SUPP
1.0000 | Freq: Once | RECTAL | Status: AC
  Administered 2018-02-15: 1 via RECTAL

## 2018-02-15 MED ORDER — MIDAZOLAM HCL 2 MG/2ML IJ SOLN
INTRAMUSCULAR | Status: AC
  Filled 2018-02-15: qty 2

## 2018-02-15 MED ORDER — ACETAMINOPHEN 10 MG/ML IV SOLN
INTRAVENOUS | Status: DC | PRN
  Administered 2018-02-15: 1000 mg via INTRAVENOUS

## 2018-02-15 MED ORDER — DEXTROSE IN LACTATED RINGERS 5 % IV SOLN
INTRAVENOUS | Status: DC
  Administered 2018-02-15 – 2018-02-16 (×3): via INTRAVENOUS

## 2018-02-15 MED ORDER — OXYCODONE HCL 5 MG PO TABS
5.0000 mg | ORAL_TABLET | Freq: Once | ORAL | Status: AC | PRN
  Administered 2018-02-15: 5 mg via ORAL

## 2018-02-15 MED ORDER — HYDROMORPHONE 1 MG/ML IV SOLN
INTRAVENOUS | Status: DC
  Administered 2018-02-15: 2 mg via INTRAVENOUS
  Administered 2018-02-15: 1.5 mg via INTRAVENOUS
  Administered 2018-02-15: 25 mg via INTRAVENOUS
  Administered 2018-02-16: 1.2 mg via INTRAVENOUS
  Administered 2018-02-16: 7.4 mL via INTRAVENOUS
  Administered 2018-02-16: 1.5 mg via INTRAVENOUS
  Filled 2018-02-15: qty 25

## 2018-02-15 MED ORDER — HYDROMORPHONE HCL 1 MG/ML IJ SOLN
0.2500 mg | INTRAMUSCULAR | Status: AC | PRN
  Administered 2018-02-15 (×8): 0.5 mg via INTRAVENOUS

## 2018-02-15 MED ORDER — HYDROMORPHONE HCL 1 MG/ML IJ SOLN
INTRAMUSCULAR | Status: AC
  Administered 2018-02-15: 0.5 mg via INTRAVENOUS
  Filled 2018-02-15: qty 1

## 2018-02-15 MED ORDER — CEFAZOLIN SODIUM-DEXTROSE 2-4 GM/100ML-% IV SOLN
INTRAVENOUS | Status: AC
  Filled 2018-02-15: qty 100

## 2018-02-15 MED ORDER — SODIUM CHLORIDE 0.9 % IJ SOLN
INTRAMUSCULAR | Status: AC
  Filled 2018-02-15: qty 50

## 2018-02-15 MED ORDER — ONDANSETRON HCL 4 MG/2ML IJ SOLN
INTRAMUSCULAR | Status: AC
  Filled 2018-02-15: qty 2

## 2018-02-15 MED ORDER — DIPHENHYDRAMINE HCL 12.5 MG/5ML PO ELIX
12.5000 mg | ORAL_SOLUTION | Freq: Four times a day (QID) | ORAL | Status: DC | PRN
  Filled 2018-02-15: qty 5

## 2018-02-15 MED ORDER — KETOROLAC TROMETHAMINE 30 MG/ML IJ SOLN: 30.0000 mg | Freq: Four times a day (QID) | INTRAMUSCULAR | Status: DC

## 2018-02-15 MED ORDER — PROPOFOL 500 MG/50ML IV EMUL
INTRAVENOUS | Status: AC
  Filled 2018-02-15: qty 50

## 2018-02-15 MED ORDER — VASOPRESSIN 20 UNIT/ML IV SOLN
INTRAVENOUS | Status: DC | PRN
  Administered 2018-02-15: 8 mL via INTRAMUSCULAR

## 2018-02-15 MED ORDER — LACTATED RINGERS IV SOLN: INTRAVENOUS | Status: DC

## 2018-02-15 MED ORDER — ONDANSETRON HCL 4 MG/2ML IJ SOLN
INTRAMUSCULAR | Status: DC | PRN
  Administered 2018-02-15: 4 mg via INTRAVENOUS

## 2018-02-15 MED ORDER — ROCURONIUM BROMIDE 50 MG/5ML IV SOLN
INTRAVENOUS | Status: AC
  Filled 2018-02-15: qty 2

## 2018-02-15 MED ORDER — DEXAMETHASONE SODIUM PHOSPHATE 10 MG/ML IJ SOLN
INTRAMUSCULAR | Status: DC | PRN
  Administered 2018-02-15: 10 mg via INTRAVENOUS

## 2018-02-15 MED ORDER — SCOPOLAMINE 1 MG/3DAYS TD PT72
1.0000 | MEDICATED_PATCH | TRANSDERMAL | Status: DC
  Administered 2018-02-15: 1.5 mg via TRANSDERMAL

## 2018-02-15 MED ORDER — LACTATED RINGERS IV SOLN
INTRAVENOUS | Status: DC
  Administered 2018-02-15 (×3): via INTRAVENOUS

## 2018-02-15 MED ORDER — NALOXONE HCL 0.4 MG/ML IJ SOLN: 0.4000 mg | INTRAMUSCULAR | Status: DC | PRN

## 2018-02-15 MED ORDER — FENTANYL CITRATE (PF) 100 MCG/2ML IJ SOLN
INTRAMUSCULAR | Status: AC
  Filled 2018-02-15: qty 2

## 2018-02-15 MED ORDER — SODIUM CHLORIDE FLUSH 0.9 % IV SOLN
INTRAVENOUS | Status: AC
  Filled 2018-02-15: qty 30

## 2018-02-15 MED ORDER — FENTANYL CITRATE (PF) 100 MCG/2ML IJ SOLN
INTRAMUSCULAR | Status: DC | PRN
  Administered 2018-02-15 (×2): 50 ug via INTRAVENOUS

## 2018-02-15 MED ORDER — BUPIVACAINE HCL (PF) 0.5 % IJ SOLN
INTRAMUSCULAR | Status: AC
  Filled 2018-02-15: qty 30

## 2018-02-15 MED ORDER — ONDANSETRON HCL 4 MG/2ML IJ SOLN
4.0000 mg | Freq: Four times a day (QID) | INTRAMUSCULAR | Status: DC | PRN
Start: 2018-02-15 — End: 2018-02-16

## 2018-02-15 MED ORDER — SODIUM CHLORIDE 0.9% FLUSH: 9.0000 mL | INTRAVENOUS | Status: DC | PRN

## 2018-02-15 MED ORDER — IBUPROFEN 600 MG PO TABS: 600.0000 mg | ORAL_TABLET | Freq: Four times a day (QID) | ORAL | Status: DC | PRN

## 2018-02-15 MED ORDER — KETOROLAC TROMETHAMINE 30 MG/ML IJ SOLN
30.0000 mg | Freq: Four times a day (QID) | INTRAMUSCULAR | Status: DC
  Administered 2018-02-15 – 2018-02-16 (×2): 30 mg via INTRAVENOUS
  Filled 2018-02-15: qty 1

## 2018-02-15 MED ORDER — KETOROLAC TROMETHAMINE 30 MG/ML IJ SOLN
30.0000 mg | Freq: Once | INTRAMUSCULAR | Status: DC
  Filled 2018-02-15: qty 1

## 2018-02-15 MED ORDER — OXYCODONE HCL 5 MG PO TABS
ORAL_TABLET | ORAL | Status: AC
  Filled 2018-02-15: qty 1

## 2018-02-15 MED ORDER — FENTANYL CITRATE (PF) 100 MCG/2ML IJ SOLN
INTRAMUSCULAR | Status: AC
  Administered 2018-02-15: 25 ug via INTRAVENOUS
  Filled 2018-02-15: qty 2

## 2018-02-15 MED ORDER — LIDOCAINE HCL (PF) 2 % IJ SOLN
INTRAMUSCULAR | Status: AC
Start: 2018-02-15 — End: 2018-02-15
  Filled 2018-02-15: qty 10

## 2018-02-15 MED ORDER — PROPOFOL 10 MG/ML IV BOLUS
INTRAVENOUS | Status: DC | PRN
  Administered 2018-02-15: 150 mg via INTRAVENOUS

## 2018-02-15 MED ORDER — OXYCODONE HCL 5 MG/5ML PO SOLN: 5.0000 mg | Freq: Once | ORAL | Status: AC | PRN

## 2018-02-15 MED ORDER — ACETAMINOPHEN 10 MG/ML IV SOLN
INTRAVENOUS | Status: AC
  Filled 2018-02-15: qty 100

## 2018-02-15 MED ORDER — SCOPOLAMINE 1 MG/3DAYS TD PT72
MEDICATED_PATCH | TRANSDERMAL | Status: AC
  Administered 2018-02-15: 1.5 mg via TRANSDERMAL
  Filled 2018-02-15: qty 1

## 2018-02-15 MED ORDER — PROMETHAZINE HCL 25 MG/ML IJ SOLN: 6.2500 mg | INTRAMUSCULAR | Status: DC | PRN

## 2018-02-15 MED ORDER — HYDROMORPHONE HCL 1 MG/ML IJ SOLN: 0.2500 mg | INTRAMUSCULAR | Status: DC | PRN

## 2018-02-15 MED ORDER — LIDOCAINE HCL (CARDIAC) 20 MG/ML IV SOLN
INTRAVENOUS | Status: DC | PRN
  Administered 2018-02-15: 80 mg via INTRAVENOUS

## 2018-02-15 MED ORDER — KETOROLAC TROMETHAMINE 30 MG/ML IJ SOLN
INTRAMUSCULAR | Status: DC | PRN
  Administered 2018-02-15: 30 mg via INTRAVENOUS

## 2018-02-15 MED ORDER — DEXAMETHASONE SODIUM PHOSPHATE 10 MG/ML IJ SOLN
INTRAMUSCULAR | Status: AC
  Filled 2018-02-15: qty 1

## 2018-02-15 MED ORDER — LIDOCAINE HCL (PF) 1 % IJ SOLN
INTRAMUSCULAR | Status: AC
  Filled 2018-02-15: qty 2

## 2018-02-15 MED ORDER — FENTANYL CITRATE (PF) 100 MCG/2ML IJ SOLN
25.0000 ug | INTRAMUSCULAR | Status: AC | PRN
  Administered 2018-02-15 (×6): 25 ug via INTRAVENOUS
  Administered 2018-02-15: 50 ug via INTRAVENOUS

## 2018-02-15 MED ORDER — DIPHENHYDRAMINE HCL 50 MG/ML IJ SOLN
12.5000 mg | Freq: Four times a day (QID) | INTRAMUSCULAR | Status: DC | PRN
  Administered 2018-02-16: 12.5 mg via INTRAVENOUS
  Filled 2018-02-15: qty 1

## 2018-02-15 MED ORDER — PROPOFOL 10 MG/ML IV BOLUS
INTRAVENOUS | Status: AC
Start: 2018-02-15 — End: 2018-02-15
  Filled 2018-02-15: qty 60

## 2018-02-15 MED ORDER — BELLADONNA ALKALOIDS-OPIUM 16.2-60 MG RE SUPP
RECTAL | Status: AC
  Filled 2018-02-15: qty 1

## 2018-02-15 MED ORDER — VASOPRESSIN 20 UNIT/ML IV SOLN
INTRAVENOUS | Status: AC
  Filled 2018-02-15: qty 1

## 2018-02-15 MED ORDER — ROCURONIUM BROMIDE 100 MG/10ML IV SOLN
INTRAVENOUS | Status: DC | PRN
  Administered 2018-02-15: 50 mg via INTRAVENOUS
  Administered 2018-02-15 (×2): 10 mg via INTRAVENOUS

## 2018-02-15 MED ORDER — HYDROMORPHONE HCL 1 MG/ML IJ SOLN
0.5000 mg | INTRAMUSCULAR | Status: AC | PRN
  Administered 2018-02-15 (×2): 0.5 mg via INTRAVENOUS

## 2018-02-15 MED ORDER — LIDOCAINE 5 % EX PTCH
MEDICATED_PATCH | CUTANEOUS | Status: AC
Start: 2018-02-15 — End: 2018-02-15
  Filled 2018-02-15: qty 1

## 2018-02-15 MED ORDER — SUGAMMADEX SODIUM 200 MG/2ML IV SOLN
INTRAVENOUS | Status: DC | PRN
  Administered 2018-02-15: 180 mg via INTRAVENOUS

## 2018-02-15 MED ORDER — PHENYLEPHRINE HCL 10 MG/ML IJ SOLN
INTRAMUSCULAR | Status: DC | PRN
  Administered 2018-02-15: 100 ug via INTRAVENOUS
  Administered 2018-02-15: 50 ug via INTRAVENOUS
  Administered 2018-02-15: 100 ug via INTRAVENOUS
  Administered 2018-02-15: 50 ug via INTRAVENOUS
  Administered 2018-02-15 (×2): 100 ug via INTRAVENOUS

## 2018-02-15 MED ORDER — FLAVOXATE HCL 100 MG PO TABS
100.0000 mg | ORAL_TABLET | Freq: Three times a day (TID) | ORAL | Status: DC | PRN
  Filled 2018-02-15: qty 1

## 2018-02-15 SURGICAL SUPPLY — 83 items
ADHESIVE MASTISOL STRL (MISCELLANEOUS) ×4 IMPLANT
BAG URINE DRAINAGE (UROLOGICAL SUPPLIES) ×4 IMPLANT
BLADE SURG 10 STRL SS SAFETY (BLADE) ×4 IMPLANT
BLADE SURG 15 STRL LF DISP TIS (BLADE) ×2 IMPLANT
BLADE SURG 15 STRL SS (BLADE) ×2
BLADE SURG SZ10 CARB STEEL (BLADE) ×4 IMPLANT
BLADE SURG SZ11 CARB STEEL (BLADE) ×4 IMPLANT
CANISTER SUCT 1200ML W/VALVE (MISCELLANEOUS) ×4 IMPLANT
CATH FOLEY 2WAY  5CC 16FR (CATHETERS) ×4
CATH URTH 16FR FL 2W BLN LF (CATHETERS) ×4 IMPLANT
CHLORAPREP W/TINT 26ML (MISCELLANEOUS) ×8 IMPLANT
CLOSURE WOUND 1/2 X4 (GAUZE/BANDAGES/DRESSINGS) ×1
DEFOGGER SCOPE WARMER CLEARIFY (MISCELLANEOUS) ×4 IMPLANT
DERMABOND ADVANCED (GAUZE/BANDAGES/DRESSINGS) ×2
DERMABOND ADVANCED .7 DNX12 (GAUZE/BANDAGES/DRESSINGS) ×2 IMPLANT
DRAPE LAPAROTOMY 100X77 ABD (DRAPES) IMPLANT
DRAPE LAPAROTOMY TRNSV 106X77 (MISCELLANEOUS) IMPLANT
DRSG TELFA 3X8 NADH (GAUZE/BANDAGES/DRESSINGS) ×4 IMPLANT
ELECT BLADE 6 FLAT ULTRCLN (ELECTRODE) ×4 IMPLANT
ELECT BLADE 6.5 EXT (BLADE) ×4 IMPLANT
ELECT CAUTERY BLADE 6.4 (BLADE) ×4 IMPLANT
ELECT REM PT RETURN 9FT ADLT (ELECTROSURGICAL) ×4
ELECTRODE REM PT RTRN 9FT ADLT (ELECTROSURGICAL) ×2 IMPLANT
GAUZE SPONGE 4X4 12PLY STRL (GAUZE/BANDAGES/DRESSINGS) ×4 IMPLANT
GLOVE BIO SURGEON STRL SZ 6.5 (GLOVE) ×3 IMPLANT
GLOVE BIO SURGEON STRL SZ7 (GLOVE) ×4 IMPLANT
GLOVE BIO SURGEONS STRL SZ 6.5 (GLOVE) ×1
GLOVE BIOGEL PI IND STRL 7.0 (GLOVE) ×2 IMPLANT
GLOVE BIOGEL PI IND STRL 7.5 (GLOVE) ×2 IMPLANT
GLOVE BIOGEL PI INDICATOR 7.0 (GLOVE) ×2
GLOVE BIOGEL PI INDICATOR 7.5 (GLOVE) ×2
GLOVE BIOGEL PI ORTHO PRO 7.5 (GLOVE) ×4
GLOVE PI ORTHO PRO STRL 7.5 (GLOVE) ×4 IMPLANT
GLOVE SURG SYN 8.0 (GLOVE) ×8 IMPLANT
GOWN STRL REUS W/ TWL LRG LVL3 (GOWN DISPOSABLE) ×6 IMPLANT
GOWN STRL REUS W/ TWL XL LVL3 (GOWN DISPOSABLE) ×4 IMPLANT
GOWN STRL REUS W/TWL LRG LVL3 (GOWN DISPOSABLE) ×6
GOWN STRL REUS W/TWL XL LVL3 (GOWN DISPOSABLE) ×4
HANDLE YANKAUER SUCT BULB TIP (MISCELLANEOUS) ×4 IMPLANT
HOLDER FOLEY CATH W/STRAP (MISCELLANEOUS) ×4 IMPLANT
IRRIGATION STRYKERFLOW (MISCELLANEOUS) IMPLANT
IRRIGATOR STRYKERFLOW (MISCELLANEOUS)
IV LACTATED RINGERS 1000ML (IV SOLUTION) ×4 IMPLANT
KIT PINK PAD W/HEAD ARE REST (MISCELLANEOUS) ×4
KIT PINK PAD W/HEAD ARM REST (MISCELLANEOUS) ×2 IMPLANT
KIT TURNOVER CYSTO (KITS) ×4 IMPLANT
KIT TURNOVER KIT A (KITS) ×4 IMPLANT
LABEL OR SOLS (LABEL) ×4 IMPLANT
LIGASURE IMPACT 36 18CM CVD LR (INSTRUMENTS) IMPLANT
LIGASURE LAP MARYLAND 5MM 37CM (ELECTROSURGICAL) IMPLANT
NEEDLE HYPO 22GX1.5 SAFETY (NEEDLE) ×4 IMPLANT
NS IRRIG 1000ML POUR BTL (IV SOLUTION) ×4 IMPLANT
PACK BASIN MAJOR ARMC (MISCELLANEOUS) ×4 IMPLANT
PACK BASIN MINOR ARMC (MISCELLANEOUS) ×4 IMPLANT
PACK GYN LAPAROSCOPIC (MISCELLANEOUS) ×4 IMPLANT
PAD OB MATERNITY 4.3X12.25 (PERSONAL CARE ITEMS) ×4 IMPLANT
RETRACTOR WND ALEXIS-O 25 LRG (MISCELLANEOUS) IMPLANT
RTRCTR WOUND ALEXIS O 25CM LRG (MISCELLANEOUS)
SLEEVE ENDOPATH XCEL 5M (ENDOMECHANICALS) ×8 IMPLANT
SOLUTION ELECTROLUBE (MISCELLANEOUS) IMPLANT
SPONGE LAP 18X18 5 PK (GAUZE/BANDAGES/DRESSINGS) ×4 IMPLANT
SPONGE XRAY 4X4 16PLY STRL (MISCELLANEOUS) ×4 IMPLANT
STAPLER SKIN PROX 35W (STAPLE) IMPLANT
STRIP CLOSURE SKIN 1/2X4 (GAUZE/BANDAGES/DRESSINGS) ×3 IMPLANT
SUT VIC AB 0 CT1 27 (SUTURE) ×4
SUT VIC AB 0 CT1 27XCR 8 STRN (SUTURE) ×4 IMPLANT
SUT VIC AB 0 CT1 36 (SUTURE) ×4 IMPLANT
SUT VIC AB 1 CT1 36 (SUTURE) IMPLANT
SUT VIC AB 4-0 FS2 27 (SUTURE) ×4 IMPLANT
SUT VICRYL 4-0  27 PS-2 BARIAT (SUTURE) ×2
SUT VICRYL 4-0 27 PS-2 BARIAT (SUTURE) ×2
SUT VICRYL AB 3-0 FS1 BRD 27IN (SUTURE) IMPLANT
SUT VICRYL+ 3-0 36IN CT-1 (SUTURE) IMPLANT
SUTURE VIC 1-0 (SUTURE) IMPLANT
SUTURE VICRYL 4-0 27 PS-2 BART (SUTURE) ×2 IMPLANT
SYR 10ML LL (SYRINGE) ×4 IMPLANT
SYR CONTROL 10ML (SYRINGE) ×4 IMPLANT
TAPE TRANSPORE STRL 2 31045 (GAUZE/BANDAGES/DRESSINGS) IMPLANT
TOWEL OR 17X26 4PK STRL BLUE (TOWEL DISPOSABLE) ×4 IMPLANT
TRAY FOLEY W/METER SILVER 16FR (SET/KITS/TRAYS/PACK) IMPLANT
TRAY PREP VAG/GEN (MISCELLANEOUS) IMPLANT
TROCAR XCEL NON-BLD 5MMX100MML (ENDOMECHANICALS) ×4 IMPLANT
TUBING INSUF HEATED (TUBING) ×4 IMPLANT

## 2018-02-15 NOTE — Progress Notes (Signed)
Dr piscitello in to see pt   Pt contains to have severe cramping    Also dr Amalia Hailey called about contained severe pain

## 2018-02-15 NOTE — Progress Notes (Signed)
Patient called RN to room demanding that her foley catheter be removed. Patient complaining of spasms and is under the impression they are associated with her catheter. Foley catheter removed per policy/patient request. Patient ambulated to bathroom and voided 100 mL of urine directly after foley removal. Will continue to monitor.

## 2018-02-15 NOTE — Progress Notes (Signed)
Spoke with Dr. Amalia Hailey about patient still not having any pain relief after multiple medications have been given, he referred me to anesthesia. I spoke with Dr. Lavone Neri with anesthesia and was told to do one more 1mg  of Dilaudid and speak with Dr. Amalia Hailey about a PCA for the floor.

## 2018-02-15 NOTE — Interval H&P Note (Signed)
History and Physical Interval Note:  02/15/2018 7:41 AM  Melanie Choi  has presented today for surgery, with the diagnosis of VULVAR LESION, MENORRHAGIA  The various methods of treatment have been discussed with the patient and family. After consideration of risks, benefits and other options for treatment, the patient has consented to  Procedure(s): LAPAROSCOPIC ASSISTED VAGINAL HYSTERECTOMY (N/A) HYSTERECTOMY ABDOMINAL (N/A) as a surgical intervention .  The patient's history has been reviewed, patient examined, no change in status, stable for surgery.  I have reviewed the patient's chart and labs.  Questions were answered to the patient's satisfaction.     Jeannie Fend

## 2018-02-15 NOTE — Op Note (Signed)
@LOGO @    OPERATIVE NOTE 02/15/2018 9:54 AM  PRE-OPERATIVE DIAGNOSIS:  1)  Menorrhagia  POST-OPERATIVE DIAGNOSIS:  1) Same with PAD  OPERATION: LAVH - bilateral salpingectomy with Lysis of Adhesions     SURGEON(S): Surgeon(s) and Role:    Harlin Heys, MD - Primary    * Defrancesco, Alanda Slim, MD - Assisting   ANESTHESIA: General  ESTIMATED BLOOD LOSS: 275 mL  OPERATIVE FINDINGS: Large omental adhesion - thickened bladder flap - otherwise normal pelvis  SPECIMEN:  ID Type Source Tests Collected by Time Destination  1 : cervix,uterus, bilateral tubes  Tissue [facility] Other SURGICAL PATHOLOGY Harlin Heys, MD 2/97/9892 1194     COMPLICATIONS: None  DRAINS: Foley to gravity  DISPOSITION: Stable to recovery room  DESCRIPTION OF PROCEDURE:      The patient was prepped and draped in the dorsolithotomy position and placed under general anesthesia. The bladder was emptied. The cervix was grasped with a multi-toothed tenaculum and a uterine manipulator was placed within the cervical os respecting the position and curvature of the uterus. After changing gloves we proceeded abdominally. A small infraumbilical incision was made and a 5 mm trocar port was placed within the abdominopelvic cavity. The opening pressure was less than 7 mmHg.  Approximately 3 and 1/2 L of carbon dioxide gas was instilled within the abdominal pelvic cavity. The laparoscope was placed and the pelvis and abdomen were carefully inspected. In the usual manner, under direct visualization right and left lower quadrant ports of 5 mm size were placed. Both ureters were identified in the pelvis prior to dissection or clamping and cutting of pedicles. Placing the laparoscope in the right lower quadrant port and operating through the umbilical port were able to lyse a large omental adhesion which was attached to the left anterior abdominal wall traveling down to the left adnexal area.  Hemostasis was noted at the  end of this lysis.  The fallopian tubes were elevated and the mesenteric side systematically coagulated and divided allowing the tube to be removed at the time of uterine removal. The round ligaments were coagulated and divided and a bladder flap was created. The upper aspect of the broad ligament was clamped coagulated and divided. The uterine arteries were skeletonized, triply coagulated and divided. Careful inspection of all pedicles and the remainder of the pelvis was performed. Hemostasis was noted. The lower quadrant ports were removed, hemostasis of the port sites was noted, and the incisions were closed in subcuticular manner. The laparoscope and trocar sleeve were removed from the infraumbilical incision, hemostasis was noted, and the incision was closed in a subcuticular manner. We then proceeded vaginally. A weighted speculum was placed posteriorly. A multi-toothed tenaculum was used to grasp the cervix and the cervix was injected in a circumferential manner with a dilute Pitressin solution. An incision was made around the cervix and the vaginal mucosa was dissected off of the cervix. The posterior cul-de-sac was identified and entered and the weighted speculum was placed within this. The anterior cul-de-sac was identified and entered and a retractor was placed and used to retract the bladder anteriorly keeping it out of the operative field. The uterosacral ligaments were clamped divided and suture ligated. The cardinal ligaments were clamped divided and suture ligated. The small remaining pedicle was clamped divided and suture ligated bilaterally allowing delivery of the specimen. Angle sutures were placed in the manner. A culdoplasty was performed. The peritoneum was identified anteriorly and then incorporating the left upper pedicle  left lower pedicle right lower pedicle right upper pedicle and anterior peritoneum a pursestring suture was placed exteriorizing all pedicles. Hemostasis of all  pedicles was noted at this time. The vaginal mucosa was then closed with a running suture of Vicryl.  The patient went to recovery room in stable condition. Clear urine was noted in the Foley at the conclusion of the procedure.  Finis Bud, M.D. 02/15/2018 9:54 AM

## 2018-02-15 NOTE — Anesthesia Preprocedure Evaluation (Signed)
Anesthesia Evaluation  Patient identified by MRN, date of birth, ID band Patient awake    Reviewed: Allergy & Precautions, H&P , NPO status , Patient's Chart, lab work & pertinent test results  History of Anesthesia Complications (+) PONV, Family history of anesthesia reaction and history of anesthetic complications  Airway Mallampati: III  TM Distance: >3 FB Neck ROM: full    Dental  (+) Chipped   Pulmonary neg pulmonary ROS, neg shortness of breath,           Cardiovascular Exercise Tolerance: Good (-) angina(-) Past MI and (-) DOE negative cardio ROS       Neuro/Psych Seizures -, Well Controlled,  negative psych ROS   GI/Hepatic Neg liver ROS, GERD  Medicated and Controlled,  Endo/Other  negative endocrine ROS  Renal/GU      Musculoskeletal  (+) Arthritis ,   Abdominal   Peds  Hematology negative hematology ROS (+)   Anesthesia Other Findings Past Medical History: No date: Anemia No date: Arthritis No date: Complication of anesthesia No date: Dysmenorrhea No date: Family history of adverse reaction to anesthesia     Comment:  MOM-NAUSEA No date: GERD (gastroesophageal reflux disease)     Comment:  OCC 2005: History of methicillin resistant staphylococcus aureus (MRSA) No date: PONV (postoperative nausea and vomiting)     Comment:  VERY NAUSEATED No date: Seizures ([place])     Comment:  AGE 80 X1  Past Surgical History: No date: ABLATION No date: BREAST SURGERY No date: CESAREAN SECTION     Comment:  X2 No date: COLONOSCOPY No date: SHOULDER SURGERY; Right No date: TUBAL LIGATION No date: UPPER GI ENDOSCOPY  BMI    Body Mass Index:  28.46 kg/m      Reproductive/Obstetrics negative OB ROS                             Anesthesia Physical Anesthesia Plan  ASA: III  Anesthesia Plan: General ETT   Post-op Pain Management:    Induction: Intravenous  PONV Risk Score  and Plan: Ondansetron, Dexamethasone, Midazolam and Scopolamine patch - Pre-op  Airway Management Planned: Oral ETT  Additional Equipment:   Intra-op Plan:   Post-operative Plan: Extubation in OR  Informed Consent: I have reviewed the patients History and Physical, chart, labs and discussed the procedure including the risks, benefits and alternatives for the proposed anesthesia with the patient or authorized representative who has indicated his/her understanding and acceptance.   Dental Advisory Given  Plan Discussed with: Anesthesiologist, CRNA and Surgeon  Anesthesia Plan Comments: (Patient consented for risks of anesthesia including but not limited to:  - adverse reactions to medications - damage to teeth, lips or other oral mucosa - sore throat or hoarseness - Damage to heart, brain, lungs or loss of life  Patient voiced understanding.)        Anesthesia Quick Evaluation

## 2018-02-15 NOTE — Anesthesia Post-op Follow-up Note (Signed)
Anesthesia QCDR form completed.        

## 2018-02-15 NOTE — Progress Notes (Signed)
Spoke with Dr. Amalia Hailey about anesthesia recommending a PCA for the floor, he agrees with anesthesia and will place orders. Also informed Dr. Amalia Hailey of patient voiding in bed pan with results of blood. Dr. Amalia Hailey ordered a foley catheter to be placed to monitor urine output and rule out bloody urine. Urine is clear and yellow. Informed Dr. Amalia Hailey of patient tachycardia, not concerned at this time.

## 2018-02-15 NOTE — Anesthesia Postprocedure Evaluation (Signed)
Anesthesia Post Note  Patient: Kaylean Tupou  Procedure(s) Performed: LAPAROSCOPIC ASSISTED VAGINAL HYSTERECTOMY (N/A ) LAPAROSCOPIC BILATERAL SALPINGECTOMY (Bilateral )  Patient location during evaluation: PACU Anesthesia Type: General Level of consciousness: awake and alert Pain management: pain level controlled Vital Signs Assessment: post-procedure vital signs reviewed and stable Respiratory status: spontaneous breathing, nonlabored ventilation, respiratory function stable and patient connected to nasal cannula oxygen Cardiovascular status: blood pressure returned to baseline and stable Postop Assessment: no apparent nausea or vomiting Anesthetic complications: no     Last Vitals:  Vitals:   02/15/18 1205 02/15/18 1210  BP: 120/74   Pulse: 88 97  Resp: 15 18  Temp:    SpO2: 100% 100%    Last Pain:  Vitals:   02/15/18 1210  TempSrc:   PainSc: 8                  Joseph K Piscitello

## 2018-02-15 NOTE — Progress Notes (Signed)
Dr. Amalia Hailey was at bedside with patient for patient discomfort. Bladder scanned patient while Dr. Amalia Hailey was still at bedside, no signs of a full bladder. Removed foley per Dr. Amalia Hailey orders.

## 2018-02-15 NOTE — Progress Notes (Signed)
Pt seen in PACU at approximately 1240 and again at 1330.   Evaluated for continued pain level despite intravenous medication by anesthesia. Evaluated with a bladder scanner -bladder seemed empty. Removed Foley catheter -pain continued. Gave antispasmodic for bladder -no improvement in pain.  Vital signs are stable with heart rate approximately 100 and stable blood pressures. ABD: Soft tender to deep palpation over bladder area. (Checked both times and her abdomen has not changed) Scant vaginal bleeding.  Foley catheter replaced with clear urine noted.   Assessment /plan:  No identifiable cause of her current increased pain.    Doubt bladder issue    Doubt intra-abdominal bleeding     We will follow vital signs    PCA for pain relief

## 2018-02-15 NOTE — Anesthesia Procedure Notes (Signed)
Procedure Name: Intubation Date/Time: 02/15/2018 7:48 AM Performed by: Lowry Bowl, CRNA Pre-anesthesia Checklist: Patient identified, Emergency Drugs available, Suction available and Patient being monitored Patient Re-evaluated:Patient Re-evaluated prior to induction Oxygen Delivery Method: Circle system utilized Preoxygenation: Pre-oxygenation with 100% oxygen Induction Type: IV induction Ventilation: Mask ventilation without difficulty Laryngoscope Size: Mac and 3 Grade View: Grade I Tube type: Oral Tube size: 7.0 mm Number of attempts: 1 Airway Equipment and Method: Stylet Placement Confirmation: ETT inserted through vocal cords under direct vision,  positive ETCO2 and breath sounds checked- equal and bilateral Secured at: 22 cm Tube secured with: Tape Dental Injury: Teeth and Oropharynx as per pre-operative assessment

## 2018-02-15 NOTE — Transfer of Care (Signed)
Immediate Anesthesia Transfer of Care Note  Patient: Melanie Choi  Procedure(s) Performed: LAPAROSCOPIC ASSISTED VAGINAL HYSTERECTOMY (N/A ) LAPAROSCOPIC BILATERAL SALPINGECTOMY (Bilateral )  Patient Location: PACU  Anesthesia Type:General  Level of Consciousness: awake, alert , oriented and patient cooperative  Airway & Oxygen Therapy: Patient Spontanous Breathing  Post-op Assessment: Report given to RN, Post -op Vital signs reviewed and stable and Patient moving all extremities  Post vital signs: Reviewed and stable  Last Vitals:  Vitals:   02/15/18 0649  BP: 123/70  Pulse: 74  Resp: 17  Temp: 36.8 C  SpO2: 98%    Last Pain:  Vitals:   02/15/18 0649  TempSrc: Oral  PainSc: 2          Complications: No apparent anesthesia complications

## 2018-02-16 ENCOUNTER — Encounter: Payer: Self-pay | Admitting: Obstetrics and Gynecology

## 2018-02-16 ENCOUNTER — Other Ambulatory Visit: Payer: Self-pay

## 2018-02-16 DIAGNOSIS — N9089 Other specified noninflammatory disorders of vulva and perineum: Secondary | ICD-10-CM | POA: Diagnosis not present

## 2018-02-16 MED ORDER — OXYCODONE-ACETAMINOPHEN 5-325 MG PO TABS
1.0000 | ORAL_TABLET | ORAL | Status: DC | PRN
  Administered 2018-02-16: 1 via ORAL
  Filled 2018-02-16: qty 1

## 2018-02-16 MED ORDER — OXYCODONE-ACETAMINOPHEN 5-325 MG PO TABS
ORAL_TABLET | ORAL | Status: AC
  Administered 2018-02-16: 1
  Filled 2018-02-16: qty 1

## 2018-02-16 MED ORDER — CLOBETASOL PROPIONATE 0.05 % EX CREA: 1.0000 "application " | TOPICAL_CREAM | Freq: Every day | CUTANEOUS | 2 refills | Status: AC

## 2018-02-16 MED ORDER — HYDROMORPHONE HCL 2 MG PO TABS: 1.0000 mg | ORAL_TABLET | ORAL | 0 refills | Status: DC | PRN

## 2018-02-16 MED ORDER — IBUPROFEN 600 MG PO TABS
600.0000 mg | ORAL_TABLET | Freq: Four times a day (QID) | ORAL | Status: DC
  Administered 2018-02-16: 600 mg via ORAL
  Filled 2018-02-16: qty 1

## 2018-02-16 NOTE — Progress Notes (Signed)
PCA discontinued per Dr. Amalia Hailey. Wasted with Letta Moynahan, RN. Patient to switch to ibuprofen and percocet, per Dr. Amalia Hailey. Hard stop given for morphine allergy (anaphylaxis). RN spoke with patient to confirm allergies. Patient confirms that morphine makes her "stop breathing" but that she has had percocet before and "will be ok taking it."

## 2018-02-16 NOTE — Progress Notes (Signed)
Patient complaining of pain 8/10. Patient received 1 PRN percocet at 10:11. Percocet ordered 1-2 PRN q4h. RN spoke with Dr. Amalia Hailey about patient taking another percocet at 12:15. Dr. Amalia Hailey ok with patient taking another one at this time.

## 2018-02-16 NOTE — Discharge Summary (Signed)
    Discharge Summary  Admit date: 02/15/2018  Discharge Date and Time:02/16/2018  1:58 PM  Discharge to:  Home  Admission Diagnosis: Menorrhagia with regular cycle failure of endometrial ablation Dysmenorrhea  Discharge  Diagnoses: Postop LAVH  OR Procedures:   Procedure(s): LAPAROSCOPIC ASSISTED VAGINAL HYSTERECTOMY LAPAROSCOPIC BILATERAL SALPINGECTOMY Date -------------------                              Discharge Day Progress Note:   Subjective:   The patient does not have complaints.  She is ambulating well. She is taking PO well. Her pain is well controlled with her current medications.  Although she rarely rates her pain under a 7, she functions as if she is not having pain-eating walking getting in and out of bed etc.  She is urinating without difficulty and is passing flatus.   Objective:  BP 126/60 (BP Location: Right Arm)   Pulse 92   Temp 98 F (36.7 C) (Oral)   Resp 18   Ht 5\' 5"  (1.651 m)   Wt 171 lb (77.6 kg)   LMP 02/04/2018 (Approximate)   SpO2 100%   BMI 28.46 kg/m     Abdomen:                         clean, dry, no drainage    Assessment:   Doing well.  Normal progress as expected.   Patient desires discharge  Plan:        Discharge home.                       Medications as directed.  Hospital Course:  No notes on file   Condition at Discharge:  good    Follow Up:   Follow-up Information    Harlin Heys, MD Follow up in 1 week(s).   Specialty:  Obstetrics and Gynecology Contact information: [place] [address] [phone]           Finis Bud, M.D. 02/16/2018 1:58 PM

## 2018-02-16 NOTE — Progress Notes (Signed)
Patient discharged home. Discharge instructions, prescriptions and follow up appointment given to and reviewed with patient. Patient verbalized understanding. Escorted via wheelchair by auxiliary.

## 2018-02-17 LAB — SURGICAL PATHOLOGY

## 2018-02-19 ENCOUNTER — Other Ambulatory Visit: Payer: Self-pay

## 2018-02-19 ENCOUNTER — Telehealth: Payer: Self-pay | Admitting: Obstetrics and Gynecology

## 2018-02-19 MED ORDER — CLOBETASOL PROPIONATE 0.05 % EX OINT: 1.0000 "application " | TOPICAL_OINTMENT | Freq: Two times a day (BID) | CUTANEOUS | 0 refills | Status: DC

## 2018-02-19 NOTE — Telephone Encounter (Signed)
Per pts insurance pt needs temovate ointment. Med erx. Pt asked about fmla paperwork. Found on SS desk. Advised pt will complete and have DJE sign on Tuesday.

## 2018-02-19 NOTE — Telephone Encounter (Signed)
Pt requested temovate ointment. Med erx. Pt requested FMLA paperwork to be completed. Located on desk behind me. Will complete and have DJE sign on Tuesday.

## 2018-02-19 NOTE — Telephone Encounter (Signed)
The patient called and state that she would like to speak with Crystal in regards to giving her an approved list of medications that will be covered by her insurance. The patient would also like to know the status of her short term disability paperwork as well. Please advise.

## 2018-02-23 ENCOUNTER — Encounter: Payer: Self-pay | Admitting: Obstetrics and Gynecology

## 2018-02-23 ENCOUNTER — Ambulatory Visit (INDEPENDENT_AMBULATORY_CARE_PROVIDER_SITE_OTHER): Payer: BLUE CROSS/BLUE SHIELD | Admitting: Obstetrics and Gynecology

## 2018-02-23 VITALS — BP 111/74 | HR 105 | Ht 65.0 in | Wt 173.2 lb

## 2018-02-23 DIAGNOSIS — R300 Dysuria: Secondary | ICD-10-CM

## 2018-02-23 DIAGNOSIS — Z9889 Other specified postprocedural states: Secondary | ICD-10-CM

## 2018-02-23 DIAGNOSIS — N3001 Acute cystitis with hematuria: Secondary | ICD-10-CM

## 2018-02-23 LAB — POCT URINALYSIS DIPSTICK
BILIRUBIN UA: NEGATIVE
GLUCOSE UA: NEGATIVE
KETONES UA: NEGATIVE
Nitrite, UA: NEGATIVE
Odor: NEGATIVE
Protein, UA: NEGATIVE
Spec Grav, UA: 1.01 (ref 1.010–1.025)
UROBILINOGEN UA: 0.2 U/dL
pH, UA: 6.5 (ref 5.0–8.0)

## 2018-02-23 MED ORDER — NITROFURANTOIN MONOHYD MACRO 100 MG PO CAPS: 100.0000 mg | ORAL_CAPSULE | Freq: Two times a day (BID) | ORAL | 0 refills | Status: DC

## 2018-02-23 NOTE — Progress Notes (Signed)
HPI:      Melanie Choi is a 32 y.o. [phone] who LMP was Patient's last menstrual period was 02/04/2018 (approximate).  Subjective:   She presents today approximately 1 week postop.  She is ambulating voiding and taking p.o. without difficulty.  She does complain of occasional midline pelvic pain/pressure and difficulty beginning her urinary stream.  She says that she has a scant vaginal bleeding (not wearing pads). She reports minimal abdominal pain at the incision sites. Denies back pain.     Hx: The following portions of the patient's history were reviewed and updated as appropriate:             She  has a past medical history of Anemia, Arthritis, Complication of anesthesia, Dysmenorrhea, Family history of adverse reaction to anesthesia, GERD (gastroesophageal reflux disease), History of methicillin resistant staphylococcus aureus (MRSA) (2005), PONV (postoperative nausea and vomiting), and Seizures ([place]). She does not have any pertinent problems on file. She  has a past surgical history that includes Tubal ligation; Cesarean section; Ablation; Shoulder surgery (Right); Breast surgery; Upper gi endoscopy; Colonoscopy; Laparoscopic assisted vaginal hysterectomy (N/A, 02/15/2018); Laparoscopic bilateral salpingectomy (Bilateral, 02/15/2018); and Abdominal hysterectomy. Her family history includes Heart attack in her father; Thyroid disease in her mother. She  reports that she has never smoked. She has never used smokeless tobacco. She reports that she drinks alcohol. She reports that she does not use drugs. She has a current medication list which includes the following prescription(s): clobetasol cream, multivitamin with minerals, clobetasol ointment, fluticasone, hydromorphone, nitrofurantoin (macrocrystal-monohydrate), and ranitidine. She is allergic to morphine and related; percocet [oxycodone-acetaminophen]; and tape.       Review of Systems:  Review of Systems  Constitutional:  Denied constitutional symptoms, night sweats, recent illness, fatigue, fever, insomnia and weight loss.  Eyes: Denied eye symptoms, eye pain, photophobia, vision change and visual disturbance.  Ears/Nose/Throat/Neck: Denied ear, nose, throat or neck symptoms, hearing loss, nasal discharge, sinus congestion and sore throat.  Cardiovascular: Denied cardiovascular symptoms, arrhythmia, chest pain/pressure, edema, exercise intolerance, orthopnea and palpitations.  Respiratory: Denied pulmonary symptoms, asthma, pleuritic pain, productive sputum, cough, dyspnea and wheezing.  Gastrointestinal: Denied, gastro-esophageal reflux, melena, nausea and vomiting.  Genitourinary: See HPI for additional information.  Musculoskeletal: Denied musculoskeletal symptoms, stiffness, swelling, muscle weakness and myalgia.  Dermatologic: Denied dermatology symptoms, rash and scar.  Neurologic: Denied neurology symptoms, dizziness, headache, neck pain and syncope.  Psychiatric: Denied psychiatric symptoms, anxiety and depression.  Endocrine: Denied endocrine symptoms including hot flashes and night sweats.   Meds:   Current Outpatient Medications on File Prior to Visit  Medication Sig Dispense Refill  . clobetasol cream (TEMOVATE) 0.99 % Apply 1 application topically at bedtime. Use once per day before bedtime 30 g 2  . Multiple Vitamin (MULTIVITAMIN WITH MINERALS) TABS tablet Take 1 tablet by mouth at bedtime.    . clobetasol ointment (TEMOVATE) 8.33 % Apply 1 application topically 2 (two) times daily. (Patient not taking: Reported on 02/23/2018) 30 g 0  . fluticasone (FLONASE) 50 MCG/ACT nasal spray Place 2 sprays into both nostrils daily. (Patient not taking: Reported on 01/26/2018) 16 g 0  . HYDROmorphone (DILAUDID) 2 MG tablet Take 0.5 tablets (1 mg total) by mouth every 4 (four) hours as needed for moderate pain or severe pain. (Patient not taking: Reported on 02/23/2018) 20 tablet 0  . ranitidine (ZANTAC) 150 MG  tablet Take 150 mg by mouth as needed for heartburn.     No current facility-administered medications on  file prior to visit.     Objective:     Vitals:   02/23/18 0906  BP: 111/74  Pulse: (!) 105              UA-positive RBCs moderate WBCs  Assessment:    [id number] Patient Active Problem List   Diagnosis Date Noted  . Post-operative state 02/15/2018     1. Post-operative state   2. Dysuria   3. Acute cystitis with hematuria     UA and patient symptoms likely consistent with postoperative UTI.   Plan:            1.  Macrobid.  Discussed increased fluid intake and use of AZO for 2 days if necessary.  I expect rapid resolution of midline pelvic pain and difficulty with urinary stream. Orders Orders Placed This Encounter  Procedures  . POCT urinalysis dipstick     Meds ordered this encounter  Medications  . nitrofurantoin, macrocrystal-monohydrate, (MACROBID) 100 MG capsule    Sig: Take 1 capsule (100 mg total) by mouth 2 (two) times daily.    Dispense:  14 capsule    Refill:  0      F/U  Return in about 1 month (around 03/23/2018). I spent 16 minutes with this patient of which greater than 50% was spent discussing postoperative course.  Possible UTI.  Finis Bud, M.D. 02/23/2018 10:55 AM

## 2018-03-02 ENCOUNTER — Telehealth: Payer: Self-pay | Admitting: *Deleted

## 2018-03-02 NOTE — Telephone Encounter (Signed)
Pt states last nite she had stabbing pain around her incision sites. She is using ibup and a heating pad with no relief. NO redness or drainage from incision sites. Taking macrobid for recent uti. NO fevers. Regular bm. NO vb or d/c. Per DJE continue with the ibup 800 q8 and heating pad. Add tylenol 1000 q6. Appt made for 4/3 at 8:15.

## 2018-03-02 NOTE — Telephone Encounter (Signed)
Patient called and states that she is experiencing abd pain. Patient had a hysterectomy 2 wks ago. She states the pain is coming from the inside more less around her incision site. She states it feels like a " Twinge" sensation. She took a 800 mg Ibuprofen that didn't work and she has a heating pad on her stomach. Patient states the pain almost has her in tears. She is requesting a call back. Her contact # is [phone]. Please advise. Thank you

## 2018-03-03 ENCOUNTER — Ambulatory Visit (INDEPENDENT_AMBULATORY_CARE_PROVIDER_SITE_OTHER): Payer: BLUE CROSS/BLUE SHIELD | Admitting: Obstetrics and Gynecology

## 2018-03-03 ENCOUNTER — Encounter: Payer: Self-pay | Admitting: Obstetrics and Gynecology

## 2018-03-03 VITALS — BP 111/68 | HR 97 | Ht 65.0 in | Wt 174.1 lb

## 2018-03-03 DIAGNOSIS — R102 Pelvic and perineal pain: Secondary | ICD-10-CM | POA: Diagnosis not present

## 2018-03-03 NOTE — Progress Notes (Signed)
HPI:      [name] is a 32 y.o. [phone] who LMP was Patient's last menstrual period was 02/04/2018 (approximate).  Subjective:   She presents today saying that over the past few days she has experienced bilateral lower quadrant pain.  The pain is intermittent and sharp and stabbing when it occurs. (Colicky) She says sometimes it doubles her over and lasts between 30 seconds and 5 minutes.  She reports that it is not associated with bowel movements or urination.  In fact since her antibiotics she reports all of her urinary symptoms have resolved.  She reports no fever or chills diarrhea nausea vomiting or difficulty eating.  She remains on ibuprofen for pain.  She reports minimal vaginal bleeding.     Hx: The following portions of the patient's history were reviewed and updated as appropriate:             She  has a past medical history of Anemia, Arthritis, Complication of anesthesia, Dysmenorrhea, Family history of adverse reaction to anesthesia, GERD (gastroesophageal reflux disease), History of methicillin resistant staphylococcus aureus (MRSA) (2005), PONV (postoperative nausea and vomiting), and Seizures ([place]). She does not have any pertinent problems on file. She  has a past surgical history that includes Tubal ligation; Cesarean section; Ablation; Shoulder surgery (Right); Breast surgery; Upper gi endoscopy; Colonoscopy; Laparoscopic assisted vaginal hysterectomy (N/A, 02/15/2018); Laparoscopic bilateral salpingectomy (Bilateral, 02/15/2018); and Abdominal hysterectomy. Her family history includes Heart attack in her father; Thyroid disease in her mother. She  reports that she has never smoked. She has never used smokeless tobacco. She reports that she drinks alcohol. She reports that she does not use drugs. She has a current medication list which includes the following prescription(s): clobetasol cream and multivitamin with minerals. She is allergic to morphine and related; percocet  [oxycodone-acetaminophen]; and tape.       Review of Systems:  Review of Systems  Constitutional: Denied constitutional symptoms, night sweats, recent illness, fatigue, fever, insomnia and weight loss.  Eyes: Denied eye symptoms, eye pain, photophobia, vision change and visual disturbance.  Ears/Nose/Throat/Neck: Denied ear, nose, throat or neck symptoms, hearing loss, nasal discharge, sinus congestion and sore throat.  Cardiovascular: Denied cardiovascular symptoms, arrhythmia, chest pain/pressure, edema, exercise intolerance, orthopnea and palpitations.  Respiratory: Denied pulmonary symptoms, asthma, pleuritic pain, productive sputum, cough, dyspnea and wheezing.  Gastrointestinal: Denied, gastro-esophageal reflux, melena, nausea and vomiting.  Genitourinary: See HPI for additional information.  Musculoskeletal: Denied musculoskeletal symptoms, stiffness, swelling, muscle weakness and myalgia.  Dermatologic: Denied dermatology symptoms, rash and scar.  Neurologic: Denied neurology symptoms, dizziness, headache, neck pain and syncope.  Psychiatric: Denied psychiatric symptoms, anxiety and depression.  Endocrine: Denied endocrine symptoms including hot flashes and night sweats.   Meds:   Current Outpatient Medications on File Prior to Visit  Medication Sig Dispense Refill  . clobetasol cream (TEMOVATE) 3.35 % Apply 1 application topically at bedtime. Use once per day before bedtime 30 g 2  . Multiple Vitamin (MULTIVITAMIN WITH MINERALS) TABS tablet Take 1 tablet by mouth at bedtime.     No current facility-administered medications on file prior to visit.     Objective:     Vitals:   03/03/18 0822  BP: 111/68  Pulse: 97               Abdomen: Soft.  Non-tender.  No rebound. no masses.  The pain could not be elicited with palpation.  Incision/s: Intact.  Healing well.  No erythema.  No drainage.  Assessment:    [id number] Patient Active Problem List   Diagnosis Date  Noted  . Post-operative state 02/15/2018     1. Pelvic pain in female     Patient is postop LAVH.  Her pain is intermittent and colicky.  No signs of infection.  Abdomen is benign.  Urinary symptoms resolved.  I cannot find any source of her pain at this time and I do not believe she has a serious condition like infection or internal bleeding.   Plan:            1.  Continue current management with the expectation that over the next few days her pains will become less and less.  2.  If pain worsens or continues unabated consider ultrasound. Orders No orders of the defined types were placed in this encounter.   No orders of the defined types were placed in this encounter.     F/U  Return for Pt to contact us if symptoms worsen, As Scheduled Post-op. I spent 16 minutes with this patient of which greater than 50% was spent discussing postop condition, pelvic pain, sources of serious concern like infection or bleeding.  Melanie Choi, M.D. 03/03/2018 8:44 AM

## 2018-03-03 NOTE — Progress Notes (Signed)
Pt is here with c/o sharp,shooting pains in lower abdomen mainly on the left side.

## 2018-03-18 ENCOUNTER — Encounter: Payer: Self-pay | Admitting: Obstetrics and Gynecology

## 2018-03-18 ENCOUNTER — Ambulatory Visit (INDEPENDENT_AMBULATORY_CARE_PROVIDER_SITE_OTHER): Payer: BLUE CROSS/BLUE SHIELD | Admitting: Obstetrics and Gynecology

## 2018-03-18 VITALS — BP 100/70 | HR 93 | Ht 65.0 in | Wt 169.5 lb

## 2018-03-18 DIAGNOSIS — Z9889 Other specified postprocedural states: Secondary | ICD-10-CM

## 2018-03-18 NOTE — Progress Notes (Signed)
HPI:      [name] is a 32 y.o. [id number] who LMP was Patient's last menstrual period was 02/04/2018 (approximate).  Subjective:   She presents today 4 weeks post-op.  She desires a return to work.  She reports that her pain is now resolved.  She is eating voiding and having bowel movements without issue.    Hx: The following portions of the patient's history were reviewed and updated as appropriate:             She  has a past medical history of Anemia, Arthritis, Complication of anesthesia, Dysmenorrhea, Family history of adverse reaction to anesthesia, GERD (gastroesophageal reflux disease), History of methicillin resistant staphylococcus aureus (MRSA) (2005), PONV (postoperative nausea and vomiting), and Seizures ([place]). She does not have any pertinent problems on file. She  has a past surgical history that includes Tubal ligation; Cesarean section; Ablation; Shoulder surgery (Right); Breast surgery; Upper gi endoscopy; Colonoscopy; Laparoscopic assisted vaginal hysterectomy (N/A, 02/15/2018); Laparoscopic bilateral salpingectomy (Bilateral, 02/15/2018); and Abdominal hysterectomy. Her family history includes Heart attack in her father; Thyroid disease in her mother. She  reports that she has never smoked. She has never used smokeless tobacco. She reports that she drinks alcohol. She reports that she does not use drugs. She has a current medication list which includes the following prescription(s): clobetasol cream and multivitamin with minerals. She is allergic to morphine and related; percocet [oxycodone-acetaminophen]; and tape.       Review of Systems:  Review of Systems  Constitutional: Denied constitutional symptoms, night sweats, recent illness, fatigue, fever, insomnia and weight loss.  Eyes: Denied eye symptoms, eye pain, photophobia, vision change and visual disturbance.  Ears/Nose/Throat/Neck: Denied ear, nose, throat or neck symptoms, hearing loss, nasal discharge, sinus  congestion and sore throat.  Cardiovascular: Denied cardiovascular symptoms, arrhythmia, chest pain/pressure, edema, exercise intolerance, orthopnea and palpitations.  Respiratory: Denied pulmonary symptoms, asthma, pleuritic pain, productive sputum, cough, dyspnea and wheezing.  Gastrointestinal: Denied, gastro-esophageal reflux, melena, nausea and vomiting.  Genitourinary: Denied genitourinary symptoms including symptomatic vaginal discharge, pelvic relaxation issues, and urinary complaints.  Musculoskeletal: Denied musculoskeletal symptoms, stiffness, swelling, muscle weakness and myalgia.  Dermatologic: Denied dermatology symptoms, rash and scar.  Neurologic: Denied neurology symptoms, dizziness, headache, neck pain and syncope.  Psychiatric: Denied psychiatric symptoms, anxiety and depression.  Endocrine: Denied endocrine symptoms including hot flashes and night sweats.   Meds:   Current Outpatient Medications on File Prior to Visit  Medication Sig Dispense Refill  . clobetasol cream (TEMOVATE) 0.93 % Apply 1 application topically at bedtime. Use once per day before bedtime 30 g 2  . Multiple Vitamin (MULTIVITAMIN WITH MINERALS) TABS tablet Take 1 tablet by mouth at bedtime.     No current facility-administered medications on file prior to visit.     Objective:     Vitals:   03/18/18 0906  BP: 100/70  Pulse: 93              Pelvic examination   Pelvic:   Vulva: Normal appearance.  No lesions.  No abnormal scarring.    Vagina: No lesions or abnormalities noted.  Support: Normal pelvic support.  Urethra No masses tenderness or scarring.  Meatus Normal size without lesions or prolapse.  Cervix:  Surgically absent  Anus: Normal exam.  No lesions.  Perineum: Normal exam.  No lesions.  Healed well.          Bimanual   Uterus:  Surgically absent  Adnexae: No masses.  Non-tender  to palpation.  Cul-de-sac: Negative for abnormality.   Abdomen: Soft.  Non-tender.  No masses.   No HSM.  Incision/s: Intact.  Healing well.  No erythema.  No drainage.      Assessment:    [id number] Patient Active Problem List   Diagnosis Date Noted  . Post-operative state 02/15/2018     1. Post-operative state     Patient doing well post-op at this time.  She desires return to work in 10 days.   Plan:            1.  Patient may resume normal activities with exception of heavy lifting and vaginal intercourse.  2.  Follow-up in 3 months.   Orders No orders of the defined types were placed in this encounter.   No orders of the defined types were placed in this encounter.     F/U  Return in about 3 months (around 06/17/2018).  Finis Bud, M.D. 03/18/2018 10:36 AM

## 2018-04-13 DIAGNOSIS — N6489 Other specified disorders of breast: Secondary | ICD-10-CM | POA: Insufficient documentation

## 2018-04-19 ENCOUNTER — Other Ambulatory Visit: Payer: Self-pay

## 2018-04-19 ENCOUNTER — Ambulatory Visit
Admission: EM | Admit: 2018-04-19 | Discharge: 2018-04-19 | Disposition: A | Discharge status: Home / Self Care | Payer: BLUE CROSS/BLUE SHIELD | Attending: Family Medicine | Admitting: Family Medicine

## 2018-04-19 ENCOUNTER — Encounter: Payer: Self-pay | Admitting: Emergency Medicine

## 2018-04-19 DIAGNOSIS — S161XXA Strain of muscle, fascia and tendon at neck level, initial encounter: Secondary | ICD-10-CM

## 2018-04-19 MED ORDER — CYCLOBENZAPRINE HCL 10 MG PO TABS: 10.0000 mg | ORAL_TABLET | Freq: Every day | ORAL | 0 refills | Status: DC

## 2018-04-19 MED ORDER — PREDNISONE 20 MG PO TABS: ORAL_TABLET | ORAL | 0 refills | Status: DC

## 2018-04-19 NOTE — ED Triage Notes (Signed)
Patient in today c/o neck pain and tingling down right arm/hand. Patient states she was stretching about ~1:30pm today and felt something "tweek".

## 2018-04-19 NOTE — ED Provider Notes (Signed)
[facility]    CSN: [id number] Arrival date & time: 04/19/18  1625     History   Chief Complaint Chief Complaint  Patient presents with  . Neck Pain    HPI Melanie Choi is a 32 y.o. female.   32 yo female with a c/o left neck pain on the right side, upper back radiating down her right arm with some arm tingling which began when she was stretching her back earlier this morning. Denies any falls, injuries, direct trauma, fevers, chills, facial or lower extremity numbness.  The history is provided by the patient.  Neck Pain    Past Medical History:  Diagnosis Date  . Anemia   . Arthritis   . Complication of anesthesia   . Dysmenorrhea   . Family history of adverse reaction to anesthesia    MOM-NAUSEA  . GERD (gastroesophageal reflux disease)    OCC  . History of methicillin resistant staphylococcus aureus (MRSA) 2005  . PONV (postoperative nausea and vomiting)    VERY NAUSEATED  . Seizures ([place])    AGE 15 X1    Patient Active Problem List   Diagnosis Date Noted  . Post-operative state 02/15/2018    Past Surgical History:  Procedure Laterality Date  . ABDOMINAL HYSTERECTOMY    . ABLATION    . BREAST SURGERY    . CESAREAN SECTION     X2  . COLONOSCOPY    . LAPAROSCOPIC ASSISTED VAGINAL HYSTERECTOMY N/A 02/15/2018   Procedure: LAPAROSCOPIC ASSISTED VAGINAL HYSTERECTOMY;  Surgeon: Harlin Heys, MD;  Location: [facility] ORS;  Service: Gynecology;  Laterality: N/A;  . LAPAROSCOPIC BILATERAL SALPINGECTOMY Bilateral 02/15/2018   Procedure: LAPAROSCOPIC BILATERAL SALPINGECTOMY;  Surgeon: Harlin Heys, MD;  Location: [facility] ORS;  Service: Gynecology;  Laterality: Bilateral;  . SHOULDER SURGERY Right   . TUBAL LIGATION    . UPPER GI ENDOSCOPY      OB History    Gravida  2   Para  2   Term  2   Preterm      AB      Living  2     SAB      TAB      Ectopic      Multiple      Live Births  2            Home Medications     Prior to Admission medications   Medication Sig Start Date End Date Taking? Authorizing Provider  Multiple Vitamin (MULTIVITAMIN WITH MINERALS) TABS tablet Take 1 tablet by mouth at bedtime.   Yes [provider]  cyclobenzaprine (FLEXERIL) 10 MG tablet Take 1 tablet (10 mg total) by mouth at bedtime. 04/19/18   Norval Gable, MD  predniSONE (DELTASONE) 20 MG tablet 3 tabs po qd x 2 days, then 2 tabs po qd x 2 days, then 1 tab po qd x 2 days, then half a tab po qd x 2 days 04/19/18   Norval Gable, MD    Family History Family History  Problem Relation Age of Onset  . Thyroid disease Mother   . Heart attack Father     Social History Social History   Tobacco Use  . Smoking status: Never Smoker  . Smokeless tobacco: Never Used  Substance Use Topics  . Alcohol use: Yes    Frequency: Never    Comment: WINE OCC  . Drug use: No     Allergies   Morphine and related; Percocet [oxycodone-acetaminophen]; and  Tape   Review of Systems Review of Systems  Musculoskeletal: Positive for neck pain.     Physical Exam Triage Vital Signs ED Triage Vitals  Enc Vitals Group     BP 04/19/18 1638 129/82     Pulse Rate 04/19/18 1638 91     Resp 04/19/18 1638 16     Temp 04/19/18 1638 98.1 F (36.7 C)     Temp Source 04/19/18 1638 Oral     SpO2 04/19/18 1638 100 %     Weight 04/19/18 1638 170 lb (77.1 kg)     Height 04/19/18 1638 5\' 5"  (1.651 m)     Head Circumference --      Peak Flow --      Pain Score 04/19/18 1637 8     Pain Loc --      Pain Edu? --      Excl. in [place]? --    No data found.  Updated Vital Signs BP 129/82 (BP Location: Left Arm)   Pulse 91   Temp 98.1 F (36.7 C) (Oral)   Resp 16   Ht 5\' 5"  (1.651 m)   Wt 170 lb (77.1 kg)   LMP 02/04/2018 (Approximate)   SpO2 100%   BMI 28.29 kg/m   Visual Acuity Right Eye Distance:   Left Eye Distance:   Bilateral Distance:    Right Eye Near:   Left Eye Near:    Bilateral Near:     Physical Exam   Constitutional: She appears well-developed and well-nourished. No distress.  Musculoskeletal:       Right shoulder: Normal.       Right elbow: Normal.      Right wrist: Normal.       Cervical back: She exhibits tenderness (over the right trapezius muscle) and spasm (over the right trapezius muscle). She exhibits normal range of motion, no bony tenderness, no swelling, no edema, no deformity, no laceration, no pain and normal pulse.  Strength 5/5 upper extremities, equal bilaterally  Skin: She is not diaphoretic.  Nursing note and vitals reviewed.    UC Treatments / Results  Labs (all labs ordered are listed, but only abnormal results are displayed) Labs Reviewed - No data to display  EKG None  Radiology No results found.  Procedures Procedures (including critical care time)  Medications Ordered in UC Medications - No data to display  Initial Impression / Assessment and Plan / UC Course  I have reviewed the triage vital signs and the nursing notes.  Pertinent labs & imaging results that were available during my care of the patient were reviewed by me and considered in my medical decision making (see chart for details).      Final Clinical Impressions(s) / UC Diagnoses   Final diagnoses:  Strain of neck muscle, initial encounter   Discharge Instructions   None    ED Prescriptions    Medication Sig Dispense Auth. Provider   cyclobenzaprine (FLEXERIL) 10 MG tablet Take 1 tablet (10 mg total) by mouth at bedtime. 30 tablet Lashawnda Hancox, Linward Foster, MD   predniSONE (DELTASONE) 20 MG tablet 3 tabs po qd x 2 days, then 2 tabs po qd x 2 days, then 1 tab po qd x 2 days, then half a tab po qd x 2 days 13 tablet Tryone Kille, MD     1. diagnosis reviewed with patient 2. rx as per orders above; reviewed possible side effects, interactions, risks and benefits  3. Recommend supportive treatment with stretches, ice/heat  4. Follow-up prn if symptoms worsen or don't  improve   Controlled Substance Prescriptions [place] Controlled Substance Registry consulted? Not Applicable   Norval Gable, MD 04/19/18 [phone]

## 2018-06-17 ENCOUNTER — Encounter: Payer: BLUE CROSS/BLUE SHIELD | Admitting: Obstetrics and Gynecology

## 2018-06-30 ENCOUNTER — Encounter: Payer: BLUE CROSS/BLUE SHIELD | Admitting: Obstetrics and Gynecology

## 2018-07-06 ENCOUNTER — Encounter: Payer: Self-pay | Admitting: Obstetrics and Gynecology

## 2018-07-06 ENCOUNTER — Ambulatory Visit (INDEPENDENT_AMBULATORY_CARE_PROVIDER_SITE_OTHER): Payer: BLUE CROSS/BLUE SHIELD | Admitting: Obstetrics and Gynecology

## 2018-07-06 VITALS — BP 127/81 | HR 96 | Ht 65.0 in | Wt 178.0 lb

## 2018-07-06 DIAGNOSIS — F329 Major depressive disorder, single episode, unspecified: Secondary | ICD-10-CM | POA: Diagnosis not present

## 2018-07-06 DIAGNOSIS — F410 Panic disorder [episodic paroxysmal anxiety] without agoraphobia: Secondary | ICD-10-CM

## 2018-07-06 DIAGNOSIS — F32A Depression, unspecified: Secondary | ICD-10-CM

## 2018-07-06 NOTE — Progress Notes (Signed)
Pt states still experiencing pelvic pain on right side.Pt states she has become very emotional over the past 6 weeks.

## 2018-07-06 NOTE — Progress Notes (Signed)
HPI:      [name] is a 32 y.o. [id number] who LMP was Patient's last menstrual period was 02/04/2018 (approximate).  Subjective:   She presents today accompanied by her husband with the complaint of "depression" and many panic attacks.  She states that this has become much worse in the last few weeks.  She has made statements to her husband "my family would be better off without me I am not needed".  Her husband has had to take off the last 2 days of work because of her condition.  He is very concerned for her.  She also complains of occasional night sweats and wants to know if all of her problems are "hormonal".  When asked about possible ideas of self-harm she hedges and essentially says my family would probably be better off without me.    Hx: The following portions of the patient's history were reviewed and updated as appropriate:             She  has a past medical history of Anemia, Arthritis, Complication of anesthesia, Dysmenorrhea, Family history of adverse reaction to anesthesia, GERD (gastroesophageal reflux disease), History of methicillin resistant staphylococcus aureus (MRSA) (2005), PONV (postoperative nausea and vomiting), and Seizures ([place]). She does not have any pertinent problems on file. She  has a past surgical history that includes Tubal ligation; Cesarean section; Ablation; Shoulder surgery (Right); Breast surgery; Upper gi endoscopy; Colonoscopy; Laparoscopic assisted vaginal hysterectomy (N/A, 02/15/2018); Laparoscopic bilateral salpingectomy (Bilateral, 02/15/2018); and Abdominal hysterectomy. Her family history includes Heart attack in her father; Thyroid disease in her mother. She  reports that she has never smoked. She has never used smokeless tobacco. She reports that she drinks alcohol. She reports that she does not use drugs. She has a current medication list which includes the following prescription(s): multivitamin with minerals, cyclobenzaprine, and  prednisone. She is allergic to morphine and related; percocet [oxycodone-acetaminophen]; and tape.       Review of Systems:  Review of Systems  Constitutional: Denied constitutional symptoms, night sweats, recent illness, fatigue, fever, insomnia and weight loss.  Eyes: Denied eye symptoms, eye pain, photophobia, vision change and visual disturbance.  Ears/Nose/Throat/Neck: Denied ear, nose, throat or neck symptoms, hearing loss, nasal discharge, sinus congestion and sore throat.  Cardiovascular: Denied cardiovascular symptoms, arrhythmia, chest pain/pressure, edema, exercise intolerance, orthopnea and palpitations.  Respiratory: Denied pulmonary symptoms, asthma, pleuritic pain, productive sputum, cough, dyspnea and wheezing.  Gastrointestinal: Denied, gastro-esophageal reflux, melena, nausea and vomiting.  Genitourinary: Denied genitourinary symptoms including symptomatic vaginal discharge, pelvic relaxation issues, and urinary complaints.  Musculoskeletal: Denied musculoskeletal symptoms, stiffness, swelling, muscle weakness and myalgia.  Dermatologic: Denied dermatology symptoms, rash and scar.  Neurologic: Denied neurology symptoms, dizziness, headache, neck pain and syncope.  Psychiatric: See HPI for additional information.  Endocrine: Denied endocrine symptoms including hot flashes and night sweats.   Meds:   Current Outpatient Medications on File Prior to Visit  Medication Sig Dispense Refill  . Multiple Vitamin (MULTIVITAMIN WITH MINERALS) TABS tablet Take 1 tablet by mouth at bedtime.    . cyclobenzaprine (FLEXERIL) 10 MG tablet Take 1 tablet (10 mg total) by mouth at bedtime. (Patient not taking: Reported on 07/06/2018) 30 tablet 0  . predniSONE (DELTASONE) 20 MG tablet 3 tabs po qd x 2 days, then 2 tabs po qd x 2 days, then 1 tab po qd x 2 days, then half a tab po qd x 2 days (Patient not taking: Reported on 07/06/2018) 13 tablet 0  No current facility-administered medications on  file prior to visit.     Objective:     Vitals:   07/06/18 0903  BP: 127/81  Pulse: 96                Assessment:    G2P2002 Patient Active Problem List   Diagnosis Date Noted  . Post-operative state 02/15/2018     1. Depression, unspecified depression type   2. Panic attacks     Patient not doing well with significant mood changes and ideas of no self-worth.   Plan:            1.  I spoke with Dr. Jake Michaelis and she recommended that the patient be seen at the [facility] clinic today.  I spoke with the patient and her husband about this and they will are heading over there right now.  The address was given to them.  Orders Orders Placed This Encounter  Procedures  . Follicle stimulating hormone    No orders of the defined types were placed in this encounter.     F/U  Return in about 6 weeks (around 08/17/2018). I spent 27 minutes involved in the care of this patient of which greater than 50% was spent discussing discussing her change in mental condition, unlikely hormonal effects, multiple questions regarding current feelings and ideation.  (See above)  Finis Bud, M.D. 07/06/2018 10:19 AM

## 2018-07-07 LAB — FOLLICLE STIMULATING HORMONE: FSH: 3.8 m[IU]/mL

## 2018-08-17 ENCOUNTER — Encounter: Payer: BLUE CROSS/BLUE SHIELD | Admitting: Obstetrics and Gynecology

## 2019-03-21 ENCOUNTER — Telehealth: Payer: Self-pay | Admitting: Obstetrics and Gynecology

## 2019-03-21 NOTE — Telephone Encounter (Signed)
I have scheduled patient to come in on Wednesday. Patient feels like she has a labia tear along with breast discharge.

## 2019-03-21 NOTE — Telephone Encounter (Signed)
The patient called and stated she is having clear nipple discharge and itching along with some other concerns. The patient is hoping to speak with a nurse or provider some time soon in regards to this concern/issue. Please advise.

## 2019-03-21 NOTE — Telephone Encounter (Signed)
What are her other concerns? Call her and find out some details including how long it has been present etc. Set up an appointment here in the office if you think I need to look at it before COVID-19 is over.

## 2019-03-21 NOTE — Telephone Encounter (Signed)
Please advise 

## 2019-03-22 ENCOUNTER — Telehealth: Payer: Self-pay

## 2019-03-22 NOTE — Telephone Encounter (Signed)
Pt called and went over COVID-19 symptoms. Pt stated that she was not having any of the symptoms and is aware of the no visitors policy.

## 2019-03-23 ENCOUNTER — Other Ambulatory Visit: Payer: Self-pay

## 2019-03-23 ENCOUNTER — Encounter: Payer: Self-pay | Admitting: Obstetrics and Gynecology

## 2019-03-23 ENCOUNTER — Ambulatory Visit (INDEPENDENT_AMBULATORY_CARE_PROVIDER_SITE_OTHER): Payer: BLUE CROSS/BLUE SHIELD | Admitting: Obstetrics and Gynecology

## 2019-03-23 VITALS — BP 123/84 | HR 89 | Ht 65.0 in | Wt 204.5 lb

## 2019-03-23 DIAGNOSIS — N6452 Nipple discharge: Secondary | ICD-10-CM

## 2019-03-23 DIAGNOSIS — R635 Abnormal weight gain: Secondary | ICD-10-CM | POA: Diagnosis not present

## 2019-03-23 DIAGNOSIS — N9089 Other specified noninflammatory disorders of vulva and perineum: Secondary | ICD-10-CM | POA: Diagnosis not present

## 2019-03-23 NOTE — Progress Notes (Signed)
Patient is having some breast discharge out of left breast. Denies pain. She is having some itching around the nipple area. She has gained weight over the last three months and breast have gotten larger. She also have an labia tear that has not healed.

## 2019-03-23 NOTE — Addendum Note (Signed)
Addended by: Durwin Glaze on: 03/23/2019 09:39 AM   Modules accepted: Orders

## 2019-03-23 NOTE — Progress Notes (Signed)
HPI:      [name] is a 33 y.o. [id number] who LMP was Patient's last menstrual period was 02/04/2018 (approximate).  Subjective:   She presents today with multiple complaints.  First she complains of her breast becoming larger and she has a discharge from the left one.  She denies significant family history of breast issues.  She says that this has happened over the last few weeks.  She describes the discharge as clear to white like "colostrum".  Secondly she states that she has a labial lesion which has been present for 2 months.  She can feel it but it is not painful.  She says it is not exacerbated or have been caused by intercourse or other vaginal insertions.  Third she has gained significant weight in the last 4 months-more than 30 pounds.  She says in the last month alone she gained 15 pounds.  She states that her activity level and eating habits have not changed despite coronavirus.  She states that she "has always worked from home and this has not caused a change in her routine".  She does report that she is much better with her panic attacks and depression.  She is currently taking BuSpar and feels as if this is working very well.    Hx: The following portions of the patient's history were reviewed and updated as appropriate:             She  has a past medical history of Anemia, Arthritis, Complication of anesthesia, Dysmenorrhea, Family history of adverse reaction to anesthesia, GERD (gastroesophageal reflux disease), History of methicillin resistant staphylococcus aureus (MRSA) (2005), PONV (postoperative nausea and vomiting), and Seizures ([place]). She does not have any pertinent problems on file. She  has a past surgical history that includes Tubal ligation; Cesarean section; Ablation; Shoulder surgery (Right); Breast surgery; Upper gi endoscopy; Colonoscopy; Laparoscopic assisted vaginal hysterectomy (N/A, 02/15/2018); Laparoscopic bilateral salpingectomy (Bilateral, 02/15/2018);  and Abdominal hysterectomy. Her family history includes Heart attack in her father; Thyroid disease in her mother. She  reports that she has never smoked. She has never used smokeless tobacco. She reports current alcohol use. She reports that she does not use drugs. She has a current medication list which includes the following prescription(s): cyclobenzaprine, multivitamin with minerals, and prednisone. She is allergic to morphine and related; percocet [oxycodone-acetaminophen]; and tape.       Review of Systems:  Review of Systems  Constitutional: Denied constitutional symptoms, night sweats, recent illness, fatigue, fever, insomnia and weight loss.  Eyes: Denied eye symptoms, eye pain, photophobia, vision change and visual disturbance.  Ears/Nose/Throat/Neck: Denied ear, nose, throat or neck symptoms, hearing loss, nasal discharge, sinus congestion and sore throat.  Cardiovascular: Denied cardiovascular symptoms, arrhythmia, chest pain/pressure, edema, exercise intolerance, orthopnea and palpitations.  Respiratory: Denied pulmonary symptoms, asthma, pleuritic pain, productive sputum, cough, dyspnea and wheezing.  Gastrointestinal: Denied, gastro-esophageal reflux, melena, nausea and vomiting.  Genitourinary: See HPI for additional information.  Musculoskeletal: Denied musculoskeletal symptoms, stiffness, swelling, muscle weakness and myalgia.  Dermatologic: Denied dermatology symptoms, rash and scar.  Neurologic: Denied neurology symptoms, dizziness, headache, neck pain and syncope.  Psychiatric: Denied psychiatric symptoms, anxiety and depression.  Endocrine: Denied endocrine symptoms including hot flashes and night sweats.   Meds:   Current Outpatient Medications on File Prior to Visit  Medication Sig Dispense Refill  . cyclobenzaprine (FLEXERIL) 10 MG tablet Take 1 tablet (10 mg total) by mouth at bedtime. (Patient not taking: Reported on 07/06/2018) 30  tablet 0  . Multiple Vitamin  (MULTIVITAMIN WITH MINERALS) TABS tablet Take 1 tablet by mouth at bedtime.    . predniSONE (DELTASONE) 20 MG tablet 3 tabs po qd x 2 days, then 2 tabs po qd x 2 days, then 1 tab po qd x 2 days, then half a tab po qd x 2 days (Patient not taking: Reported on 07/06/2018) 13 tablet 0   No current facility-administered medications on file prior to visit.     Objective:     Vitals:   03/23/19 0845  BP: 123/84  Pulse: 89              Breast: Examination reveals no masses, no axillary adenopathy, nipple discharge could not be elicited.   Labia: Right labia reveals an ulcerated appearing lesion approximately 1 cm in diameter.  Painless to palpation.   Assessment:    [id number] Patient Active Problem List   Diagnosis Date Noted  . Post-operative state 02/15/2018     1. Breast discharge   2. Excessive weight gain   3. Labial lesion        Plan:            1.  Bacterial and viral cultures performed on labial lesion.  RPR drawn.  Recommend labial biopsy if these are all negative.  2.  TSH prolactin FSH/LH ordered.  This in and investigation of her weight gain and breast discharge.   Orders Orders Placed This Encounter  Procedures  . FSH/LH  . TSH  . Prolactin    No orders of the defined types were placed in this encounter.     F/U  No follow-ups on file. I spent 27 minutes involved in the care of this patient of which greater than 50% was spent discussing weight gain, breast discharge, family history of breast issues, labial lesion including differential diagnosis, need for cultures blood work and possibility of labial biopsy.  All questions answered.  Finis Bud, M.D. 03/23/2019 9:21 AM

## 2019-03-24 LAB — FSH/LH
FSH: 7.2 m[IU]/mL
LH: 7.5 m[IU]/mL

## 2019-03-24 LAB — RPR: RPR Ser Ql: NONREACTIVE

## 2019-03-24 LAB — PROLACTIN: Prolactin: 9.8 ng/mL (ref 4.8–23.3)

## 2019-03-24 LAB — TSH: TSH: 2.27 u[IU]/mL (ref 0.450–4.500)

## 2019-03-25 LAB — HERPES SIMPLEX VIRUS CULTURE

## 2019-03-27 LAB — ANAEROBIC AND AEROBIC CULTURE

## 2019-04-11 ENCOUNTER — Telehealth: Payer: Self-pay

## 2019-04-11 NOTE — Telephone Encounter (Signed)
Coronavirus (COVID-19) Are you at risk?  Are you at risk for the Coronavirus (COVID-19)?  To be considered HIGH RISK for Coronavirus (COVID-19), you have to meet the following criteria:  . Traveled to [place], [place], [place], [place] or [place]; or in the [place] to [place], [place], [place], or [place]; and have fever, cough, and shortness of breath within the last 2 weeks of travel OR . Been in close contact with a person diagnosed with COVID-19 within the last 2 weeks and have fever, cough, and shortness of breath . IF YOU DO NOT MEET THESE CRITERIA, YOU ARE CONSIDERED LOW RISK FOR COVID-19.  What to do if you are HIGH RISK for COVID-19?  . If you are having a medical emergency, call 911. . Seek medical care right away. Before you go to a doctor's office, urgent care or emergency department, call ahead and tell them about your recent travel, contact with someone diagnosed with COVID-19, and your symptoms. You should receive instructions from your physician's office regarding next steps of care.  . When you arrive at healthcare provider, tell the healthcare staff immediately you have returned from visiting [place], [place], [place], [place] or [place]; or traveled in the [place] to [place], [place], [place], or [place]; in the last two weeks or you have been in close contact with a person diagnosed with COVID-19 in the last 2 weeks.   . Tell the health care staff about your symptoms: fever, cough and shortness of breath. . After you have been seen by a medical provider, you will be either: o Tested for (COVID-19) and discharged home on quarantine except to seek medical care if symptoms worsen, and asked to  - Stay home and avoid contact with others until you get your results (4-5 days)  - Avoid travel on public transportation if possible (such as bus, train, or airplane) or o Sent to the Emergency Department by EMS for evaluation, COVID-19 testing, and possible  admission depending on your condition and test results.  What to do if you are LOW RISK for COVID-19?  Reduce your risk of any infection by using the same precautions used for avoiding the common cold or flu:  . Wash your hands often with soap and warm water for at least 20 seconds.  If soap and water are not readily available, use an alcohol-based hand sanitizer with at least 60% alcohol.  . If coughing or sneezing, cover your mouth and nose by coughing or sneezing into the elbow areas of your shirt or coat, into a tissue or into your sleeve (not your hands). . Avoid shaking hands with others and consider head nods or verbal greetings only. . Avoid touching your eyes, nose, or mouth with unwashed hands.  . Avoid close contact with people who are sick. . Avoid places or events with large numbers of people in one location, like concerts or sporting events. . Carefully consider travel plans you have or are making. . If you are planning any travel outside or inside the [place], visit the CDC's Travelers' Health webpage for the latest health notices. . If you have some symptoms but not all symptoms, continue to monitor at home and seek medical attention if your symptoms worsen. . If you are having a medical emergency, call 911.   ADDITIONAL HEALTHCARE OPTIONS FOR PATIENTS  [facility] / e-Visit: [email or web address]         [facility]: [phone]  [facility]: [phone]                   [facility]: [phone]   

## 2019-04-12 ENCOUNTER — Other Ambulatory Visit: Payer: Self-pay

## 2019-04-12 ENCOUNTER — Other Ambulatory Visit (HOSPITAL_COMMUNITY)
Admission: RE | Admit: 2019-04-12 | Discharge: 2019-04-12 | Disposition: A | Discharge status: Home / Self Care | Payer: BLUE CROSS/BLUE SHIELD | Source: Ambulatory Visit | Attending: Obstetrics and Gynecology | Admitting: Obstetrics and Gynecology

## 2019-04-12 ENCOUNTER — Encounter: Payer: Self-pay | Admitting: Obstetrics and Gynecology

## 2019-04-12 ENCOUNTER — Ambulatory Visit (INDEPENDENT_AMBULATORY_CARE_PROVIDER_SITE_OTHER): Payer: BLUE CROSS/BLUE SHIELD | Admitting: Obstetrics and Gynecology

## 2019-04-12 VITALS — BP 128/75 | HR 98 | Ht 65.0 in | Wt 201.9 lb

## 2019-04-12 DIAGNOSIS — N6452 Nipple discharge: Secondary | ICD-10-CM | POA: Diagnosis not present

## 2019-04-12 DIAGNOSIS — N9089 Other specified noninflammatory disorders of vulva and perineum: Secondary | ICD-10-CM

## 2019-04-12 NOTE — Addendum Note (Signed)
Addended by: Durwin Glaze on: 04/12/2019 04:12 PM   Modules accepted: Orders

## 2019-04-12 NOTE — Progress Notes (Signed)
HPI:      Melanie Choi is a 33 y.o. [id number] who LMP was Patient's last menstrual period was 02/04/2018 (approximate).  Subjective:   She presents today with the exact same 2 issues at her last visit.  She continues to experience unilateral breast discharge with bilateral breast enlargement.  Since her last visit her TSH prolactin FSH and LH are normal. In addition her labial lesion remains.  It is a nontender lesion of the inner right labia minus-she states it occasionally bleeds when irritated.  Testing to date has been negative including RPR.    Hx: The following portions of the patient's history were reviewed and updated as appropriate:             She  has a past medical history of Anemia, Arthritis, Complication of anesthesia, Dysmenorrhea, Family history of adverse reaction to anesthesia, GERD (gastroesophageal reflux disease), History of methicillin resistant staphylococcus aureus (MRSA) (2005), PONV (postoperative nausea and vomiting), and Seizures ([place]). She does not have any pertinent problems on file. She  has a past surgical history that includes Tubal ligation; Cesarean section; Ablation; Shoulder surgery (Right); Breast surgery; Upper gi endoscopy; Colonoscopy; Laparoscopic assisted vaginal hysterectomy (N/A, 02/15/2018); Laparoscopic bilateral salpingectomy (Bilateral, 02/15/2018); and Abdominal hysterectomy. Her family history includes Heart attack in her father; Thyroid disease in her mother. She  reports that she has never smoked. She has never used smokeless tobacco. She reports current alcohol use. She reports that she does not use drugs. She has a current medication list which includes the following prescription(s): multivitamin with minerals. She is allergic to morphine and related; percocet [oxycodone-acetaminophen]; and tape.       Review of Systems:  Review of Systems  Constitutional: Denied constitutional symptoms, night sweats, recent illness, fatigue, fever,  insomnia and weight loss.  Eyes: Denied eye symptoms, eye pain, photophobia, vision change and visual disturbance.  Ears/Nose/Throat/Neck: Denied ear, nose, throat or neck symptoms, hearing loss, nasal discharge, sinus congestion and sore throat.  Cardiovascular: Denied cardiovascular symptoms, arrhythmia, chest pain/pressure, edema, exercise intolerance, orthopnea and palpitations.  Respiratory: Denied pulmonary symptoms, asthma, pleuritic pain, productive sputum, cough, dyspnea and wheezing.  Gastrointestinal: Denied, gastro-esophageal reflux, melena, nausea and vomiting.  Genitourinary: See HPI for additional information.  Musculoskeletal: Denied musculoskeletal symptoms, stiffness, swelling, muscle weakness and myalgia.  Dermatologic: Denied dermatology symptoms, rash and scar.  Neurologic: Denied neurology symptoms, dizziness, headache, neck pain and syncope.  Psychiatric: Denied psychiatric symptoms, anxiety and depression.  Endocrine: Denied endocrine symptoms including hot flashes and night sweats.   Meds:   Current Outpatient Medications on File Prior to Visit  Medication Sig Dispense Refill  . Multiple Vitamin (MULTIVITAMIN WITH MINERALS) TABS tablet Take 1 tablet by mouth at bedtime.     No current facility-administered medications on file prior to visit.     Objective:     Vitals:   04/12/19 1509  BP: 128/75  Pulse: 98              Vulvar lesion remains approximately 1 cm in diameter with an obvious rolled edge.  It is not an ulcerative type lesion.  2 biopsies performed Area cleaned using Betadine.  Lidocaine with epinephrine.  Small biopsy of edge and small biopsy of central area of lesion sent separately.  Minimal bleeding easily controlled with silver nitrate stick.  Assessment:    [id number] Patient Active Problem List   Diagnosis Date Noted  . Post-operative state 02/15/2018     1. Labial lesion  2. Breast discharge        Plan:            1.   Await labial biopsy results consider referral to GYN oncology if no further information.  2.  Use of Claritin, binding of breasts, cabbage leaves discussed.  3.  Imaging of breast using mammography/ultrasound scheduled. Orders No orders of the defined types were placed in this encounter.   No orders of the defined types were placed in this encounter.     F/U  Return for We will contact her with any abnormal test results. I spent 18 minutes involved in the care of this patient of which greater than 50% was spent discussing breast discharge and work-up, labial lesion biopsy and differential diagnosis.  All questions answered.  Finis Bud, M.D. 04/12/2019 3:56 PM

## 2019-04-12 NOTE — Progress Notes (Signed)
Patient comes in today for left breast tenderness and discharge. She still has the tear on the labia that she would like looked at.

## 2019-04-15 ENCOUNTER — Telehealth: Payer: Self-pay

## 2019-04-15 NOTE — Telephone Encounter (Signed)
Has a few questions in regards to wound care.   Pls advise.

## 2019-04-15 NOTE — Telephone Encounter (Signed)
Pt called and informed her to clean the area with dial soap and water and keep the area clean. Pt was advised that if she noticed that the discharge change in color or has an odor to please call and office to make an appointment to [facility].

## 2019-04-21 ENCOUNTER — Telehealth: Payer: Self-pay | Admitting: Obstetrics and Gynecology

## 2019-04-21 DIAGNOSIS — N63 Unspecified lump in unspecified breast: Secondary | ICD-10-CM

## 2019-04-21 NOTE — Telephone Encounter (Signed)
Pt called requesting her test results that were done on 04/12/19. Pt also requested to have a u/s mammogram and a 3D breast exam which has already been placed. Pt is excepting a call tomorrow concerning her test results on 04/12/19 the results are in but needs to be reviewed.

## 2019-04-21 NOTE — Telephone Encounter (Signed)
The patient called and stated that she needs to speak with her provider or nurse in regards to her status of her results. Please advise.

## 2019-04-22 NOTE — Telephone Encounter (Signed)
Please advise on lab results.

## 2019-05-03 ENCOUNTER — Other Ambulatory Visit: Payer: Self-pay | Admitting: Surgical

## 2019-05-03 DIAGNOSIS — N9089 Other specified noninflammatory disorders of vulva and perineum: Secondary | ICD-10-CM

## 2019-05-05 ENCOUNTER — Ambulatory Visit
Admission: RE | Admit: 2019-05-05 | Discharge: 2019-05-05 | Disposition: A | Discharge status: Home / Self Care | Payer: BC Managed Care – PPO | Source: Ambulatory Visit | Attending: Obstetrics and Gynecology | Admitting: Obstetrics and Gynecology

## 2019-05-05 ENCOUNTER — Other Ambulatory Visit: Payer: Self-pay

## 2019-05-05 ENCOUNTER — Telehealth: Payer: Self-pay | Admitting: Obstetrics and Gynecology

## 2019-05-05 DIAGNOSIS — N6452 Nipple discharge: Secondary | ICD-10-CM

## 2019-05-05 IMAGING — MG DIGITAL DIAGNOSTIC BILATERAL MAMMOGRAM WITH TOMO AND CAD
6 of 10 series · 6 of 30 positions shown · non-contrast
Comparison: None.

CLINICAL DATA: Patient with spontaneous and non spontaneous left
nipple discharge intermittently over the last 1-2 months. Patient
also has some lateral left breast pain.

EXAM:
DIGITAL DIAGNOSTIC BILATERAL MAMMOGRAM WITH CAD AND TOMO
ULTRASOUND LEFT BREAST

[L CC synth-2D (1 of 2)]
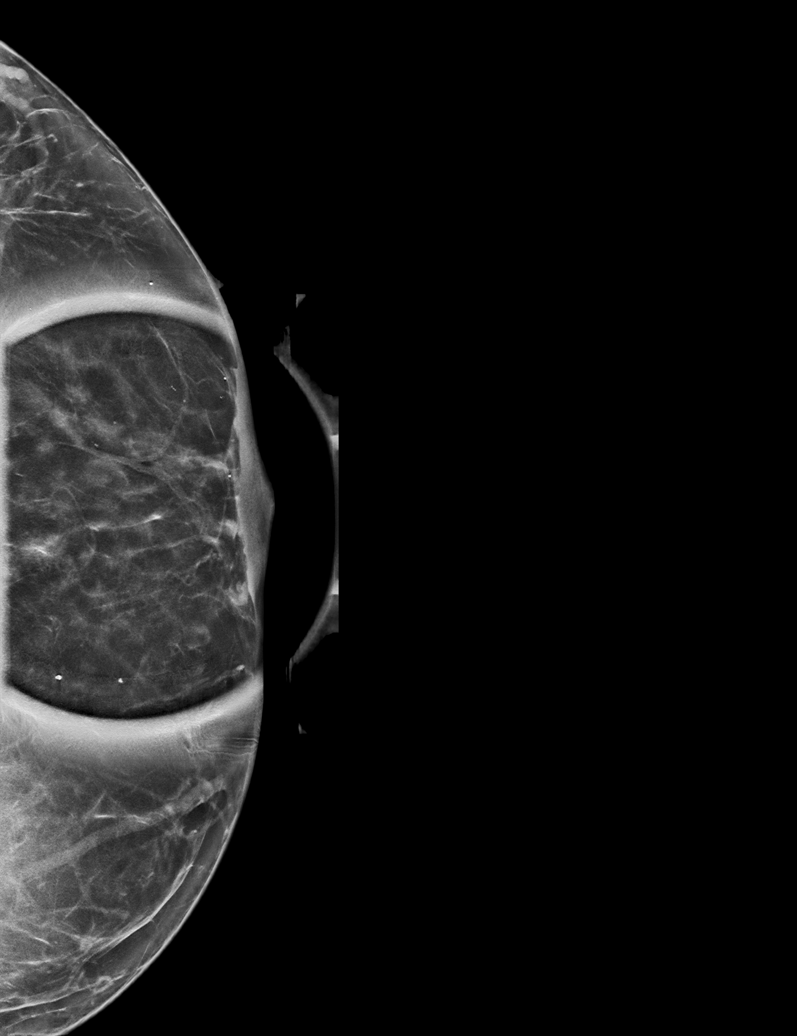

[R MLO synth-2D]
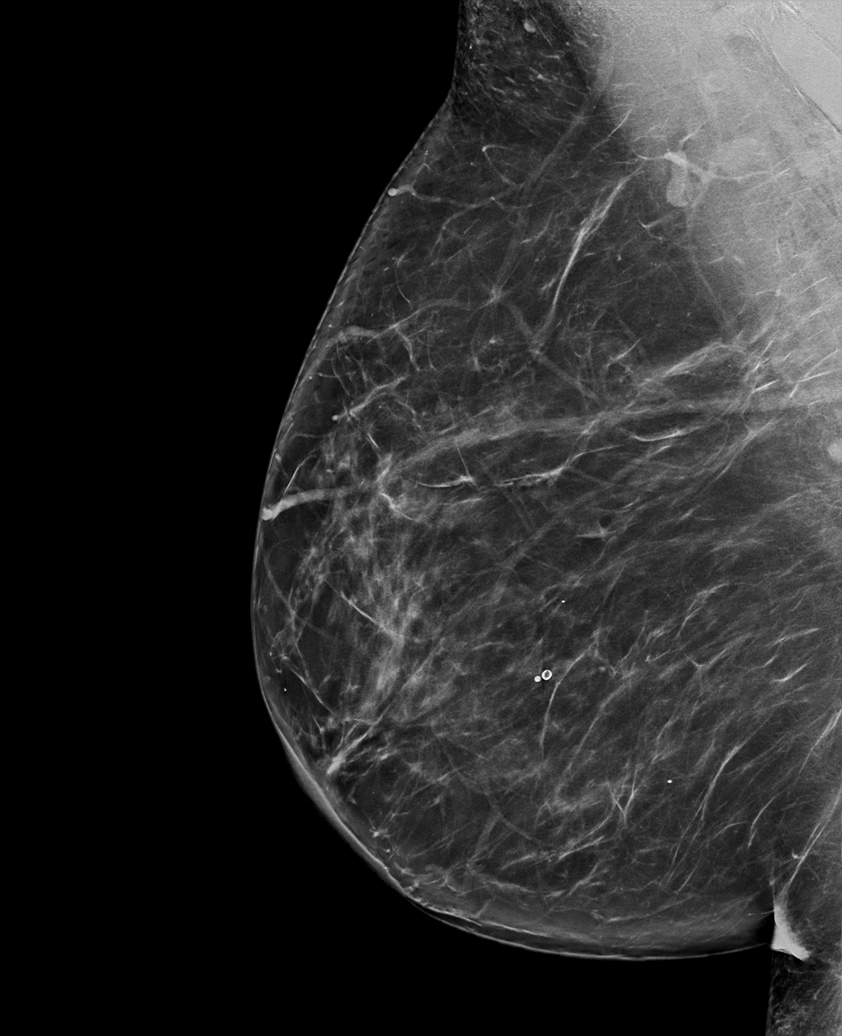

[L CC synth-2D (2 of 2)]
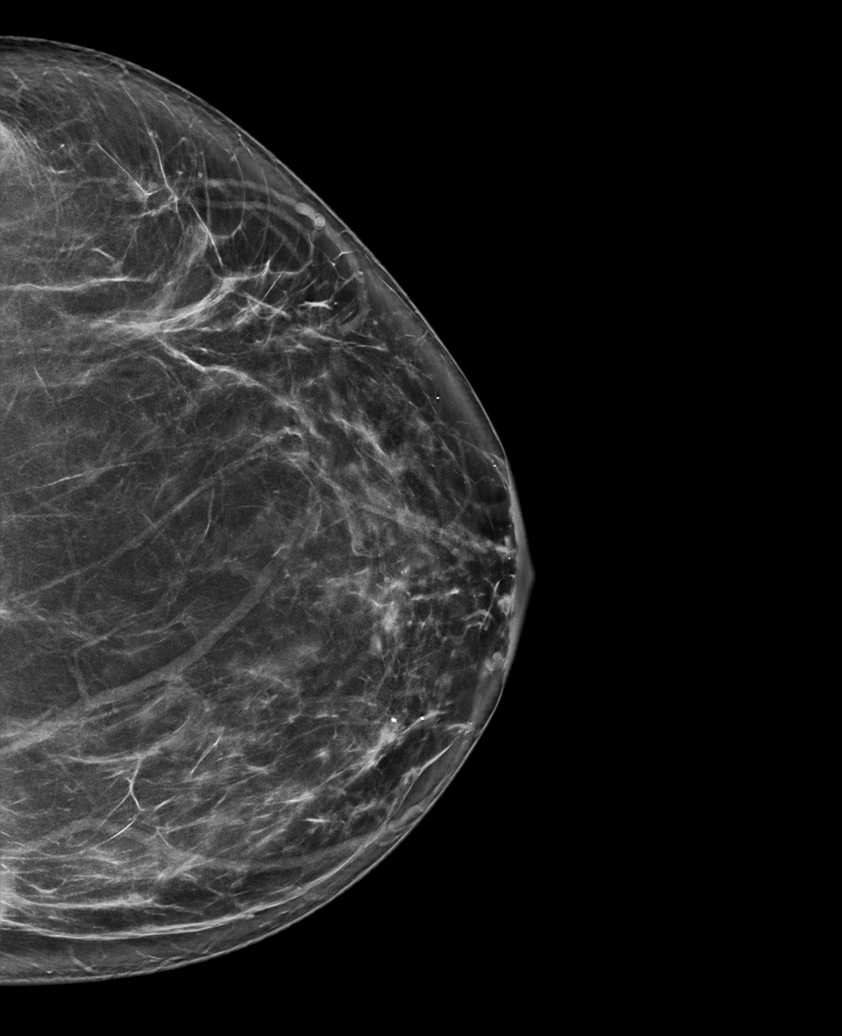

[R CC synth-2D]
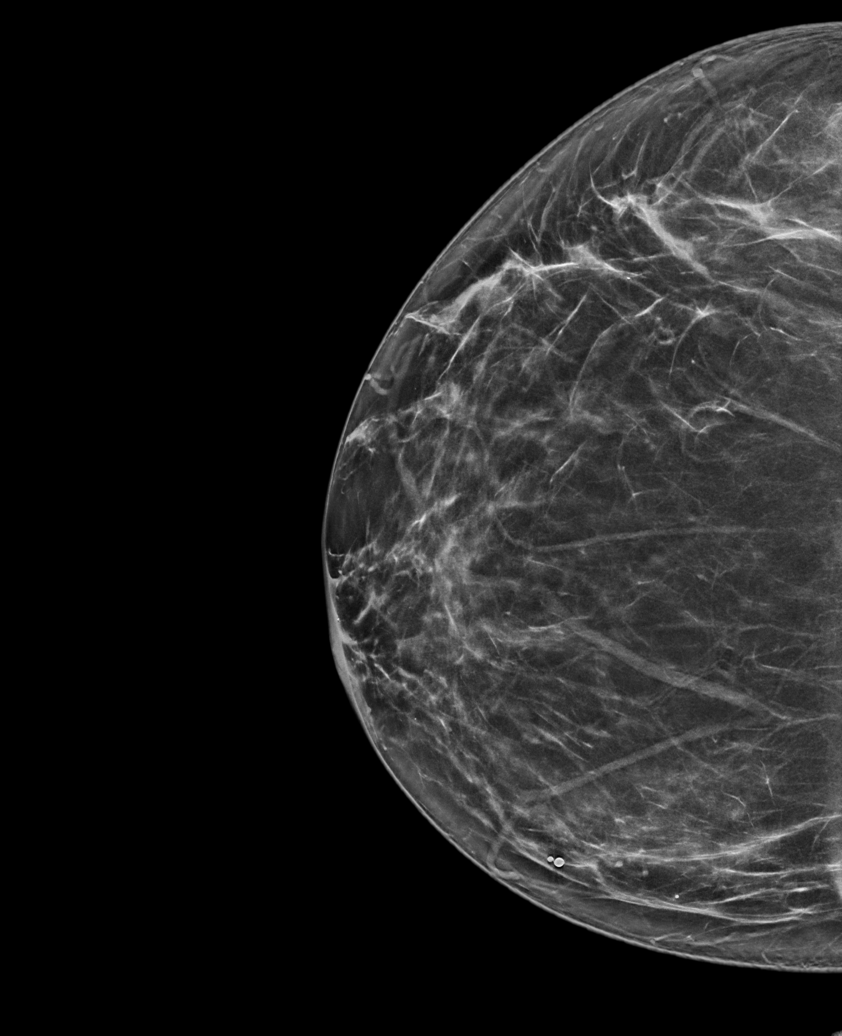

[L MLO synth-2D]
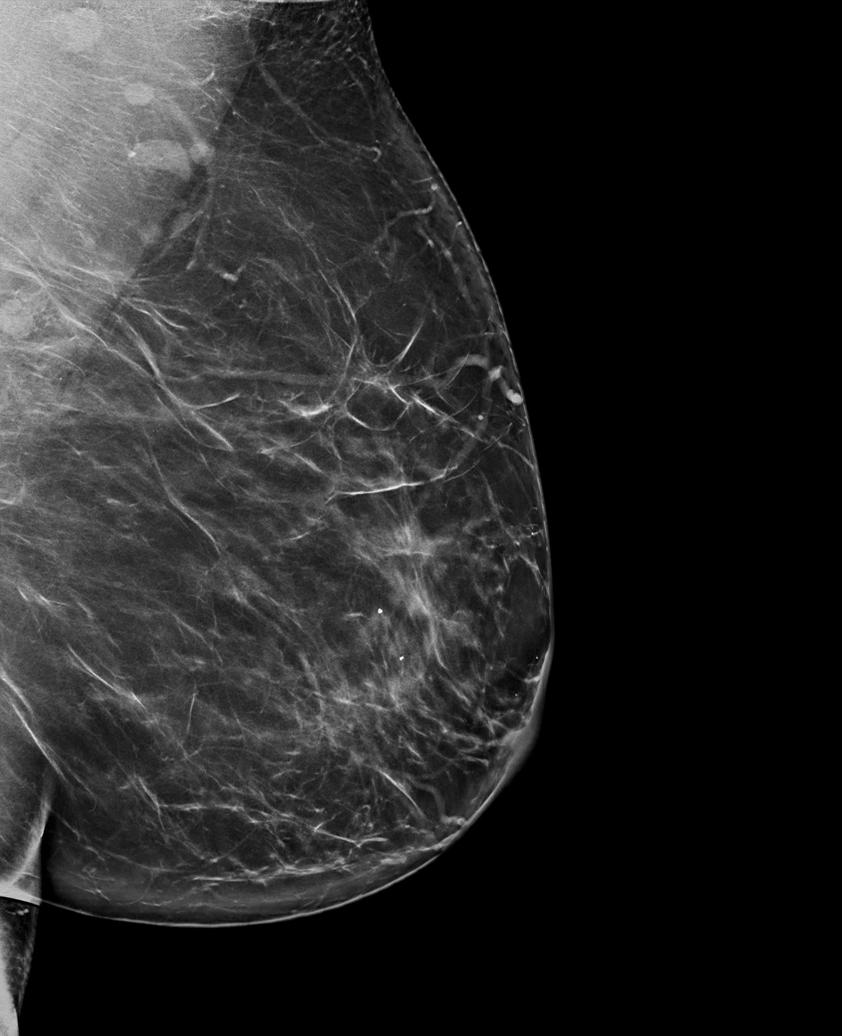

[R CC tomo · tomo slice 39/78.0]
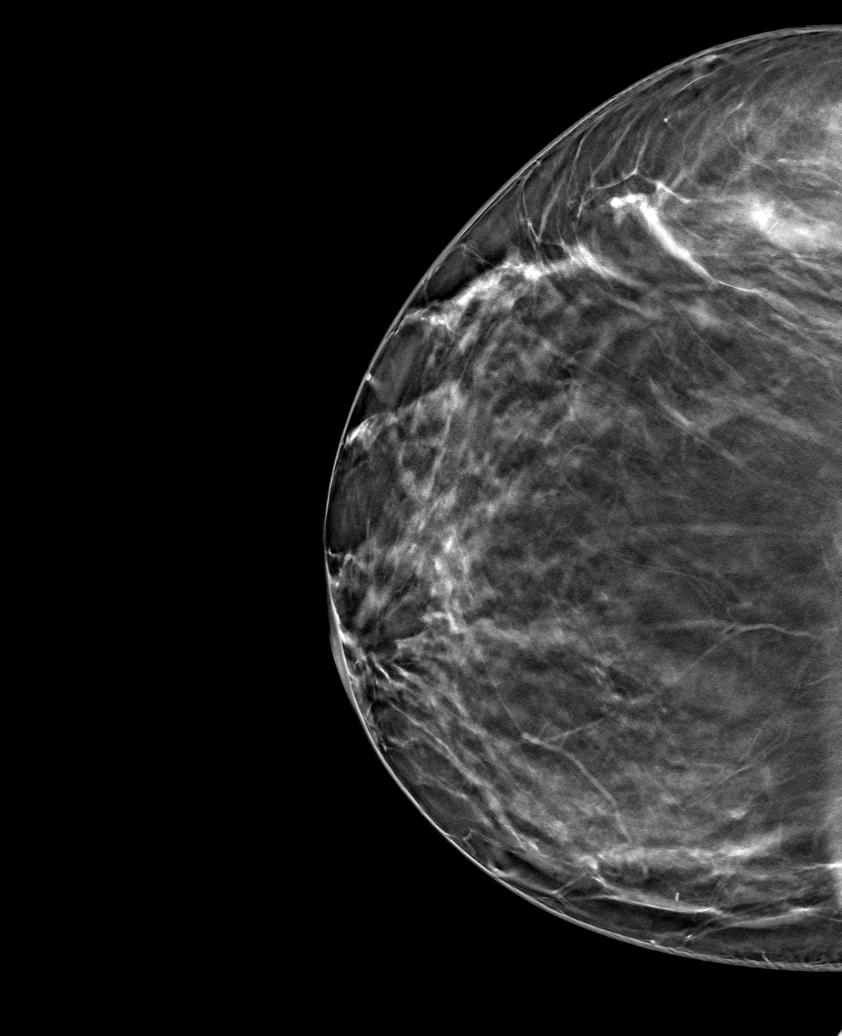

[6 of 30 positions shown; findings below may reference images not displayed]

ACR Breast Density Category b: There are scattered areas of
fibroglandular density.
FINDINGS: There are no masses, areas of architectural distortion, areas of
significant asymmetry or suspicious calcifications.

Mammographic images were processed with CAD.

On physical exam, no nipple discharge is visualized. There is no
retroareolar mass.

Targeted ultrasound is performed, showing normal tissue throughout
the entire retroareolar region as well as the lateral left breast.
No masses or suspicious lesions. No dilated ducts.
IMPRESSION: 1. Negative exam. No evidence of breast malignancy. No findings to
account for the patient's nipple discharge.

RECOMMENDATION:
1. Screening mammogram at age 40 unless there are persistent or
intervening clinical concerns. (Code:[SO])
2. Recommend surgical consultation for the left nipple discharge.
Consider follow-up breast MRI if the discharge is felt to be of
clinical concern.

I have discussed the findings and recommendations with the patient.
Results were also provided in writing at the conclusion of the
visit. If applicable, a reminder letter will be sent to the patient
regarding the next appointment.

BI-RADS CATEGORY  1: Negative.

## 2019-05-05 NOTE — Telephone Encounter (Signed)
LM for patient to return call.

## 2019-05-05 NOTE — Telephone Encounter (Signed)
The patient called and stated that she needs her referral that was entered in for [facility] to be changed to [facility]. The patient is requesting a call back in regards to some other questions and concerns she has in regards to the referral. Please advise.

## 2019-05-10 NOTE — Telephone Encounter (Signed)
Per JW pt has been [place] care of.

## 2019-05-10 NOTE — Telephone Encounter (Signed)
Melanie Choi is working on this.

## 2019-05-31 ENCOUNTER — Telehealth: Payer: Self-pay | Admitting: Obstetrics and Gynecology

## 2019-05-31 NOTE — Telephone Encounter (Signed)
Thedore Mins from [facility] called and stated that the referring needed the pt's last pathology report as soon as possible due to referral not having that information. Verbal consent from Pt and pt's husband. Records faxed.

## 2019-06-15 ENCOUNTER — Telehealth: Payer: Self-pay | Admitting: Obstetrics and Gynecology

## 2019-06-15 NOTE — Telephone Encounter (Signed)
Patient called requesting a referral to a breast specialist at [facility]. Thanks

## 2019-06-15 NOTE — Telephone Encounter (Signed)
Called and spoke with patient.  Patient requesting referral to [facility] due to continued left breast nipple discharge.  Patient also request recent mammogram, breast ultrasound and 03/23/19 blood work results be faxed once appointment is scheduled.  Are you ok to proceed with this request?  Thanks. Jennye Moccasin

## 2019-06-30 ENCOUNTER — Other Ambulatory Visit: Payer: Self-pay | Admitting: Surgical

## 2019-06-30 DIAGNOSIS — N6452 Nipple discharge: Secondary | ICD-10-CM

## 2019-07-06 ENCOUNTER — Ambulatory Visit (INDEPENDENT_AMBULATORY_CARE_PROVIDER_SITE_OTHER): Payer: BC Managed Care – PPO | Admitting: Surgery

## 2019-07-06 ENCOUNTER — Encounter: Payer: Self-pay | Admitting: Surgery

## 2019-07-06 ENCOUNTER — Other Ambulatory Visit: Payer: Self-pay

## 2019-07-06 VITALS — BP 141/92 | HR 101 | Temp 98.1°F | Resp 16 | Ht 65.0 in | Wt 194.2 lb

## 2019-07-06 DIAGNOSIS — N6452 Nipple discharge: Secondary | ICD-10-CM

## 2019-07-06 NOTE — Patient Instructions (Addendum)

## 2019-07-06 NOTE — Progress Notes (Signed)
Patient ID: Melanie Choi, female   DOB: [birth year], 33 y.o.   MRN: [id number]  HPI Melanie Choi is a 33 y.o. female seen in consultation at the request of Dr. Amalia Hailey for left nipple discharge.  She reports that she has had a breast discharge for the last several months.  It is intermittent and she reports that the discharge is clear.  Of note she did have a breast reduction in 2011.  Her menses started at age 39 she has had 2 pregnancies she did have OCPs until age 31.  She did have a partial hysterectomy but she continues to have her ovaries.  She had a maternal aunt with breast cancer.  She also reports some nipple itching.  There is some breast discomfort and axillary pain.  She reports some intermittent pain that is mild and dull in nature.  She did have a mammogram and ultrasound that I have personally reviewed showing no evidence of any concerning lesions.  There is no specific alleviating or aggravating factors.  HPI  Past Medical History:  Diagnosis Date  . Anemia   . Arthritis   . Complication of anesthesia   . Dysmenorrhea   . Family history of adverse reaction to anesthesia    MOM-NAUSEA  . GERD (gastroesophageal reflux disease)    OCC  . History of methicillin resistant staphylococcus aureus (MRSA) 2005  . PONV (postoperative nausea and vomiting)    VERY NAUSEATED  . Seizures ([place])    AGE 19 X1    Past Surgical History:  Procedure Laterality Date  . ABDOMINAL HYSTERECTOMY    . ABLATION    . BREAST SURGERY    . CESAREAN SECTION     X2  . COLONOSCOPY    . LAPAROSCOPIC ASSISTED VAGINAL HYSTERECTOMY N/A 02/15/2018   Procedure: LAPAROSCOPIC ASSISTED VAGINAL HYSTERECTOMY;  Surgeon: Harlin Heys, MD;  Location: [facility] ORS;  Service: Gynecology;  Laterality: N/A;  . LAPAROSCOPIC BILATERAL SALPINGECTOMY Bilateral 02/15/2018   Procedure: LAPAROSCOPIC BILATERAL SALPINGECTOMY;  Surgeon: Harlin Heys, MD;  Location: [facility] ORS;  Service: Gynecology;  Laterality: Bilateral;   . REDUCTION MAMMAPLASTY    . SHOULDER SURGERY Right   . TUBAL LIGATION    . UPPER GI ENDOSCOPY      Family History  Problem Relation Age of Onset  . Thyroid disease Mother   . Heart attack Father   . Breast cancer Maternal Aunt     Social History Social History   Tobacco Use  . Smoking status: Never Smoker  . Smokeless tobacco: Never Used  Substance Use Topics  . Alcohol use: Yes    Frequency: Never    Comment: WINE OCC  . Drug use: No    Allergies  Allergen Reactions  . Morphine And Related Anaphylaxis  . Percocet [Oxycodone-Acetaminophen] Rash  . Tape Rash    IF LEFT ON FOR LONG PERIODS OF TIME    Current Outpatient Medications  Medication Sig Dispense Refill  . clonazePAM (KLONOPIN) 0.5 MG tablet Take by mouth.    . Multiple Vitamin (MULTI-VITAMIN) tablet Take by mouth.     No current facility-administered medications for this visit.      Review of Systems Full ROS  was asked and was negative except for the information on the HPI  Physical Exam Blood pressure (!) 141/92, pulse (!) 101, temperature 98.1 F (36.7 C), temperature source Temporal, resp. rate 16, height 5\' 5"  (1.651 m), weight 194 lb 3.2 oz (88.1 kg), last menstrual period 02/04/2018, SpO2 97 %.  CONSTITUTIONAL: NAD EYES: Pupils are equal, round, and reactive to light, Sclera are non-icteric. EARS, NOSE, MOUTH AND THROAT: The oropharynx is clear. The oral mucosa is pink and moist. Hearing is intact to voice. LYMPH NODES:  Lymph nodes in the neck are normal. RESPIRATORY:  Lungs are clear. There is normal respiratory effort, with equal breath sounds bilaterally, and without pathologic use of accessory muscles. CARDIOVASCULAR: Heart is regular without murmurs, gallops, or rubs. BREAST: There is no evidence of masses there is normal nipples no evidence of lymphadenopathy.  She was able to express may be a drop or 2 of serous discharge coming from a single duct located around 2:00 within the nipple.   The palpation is mildly uncomfortable but no evidence of significant tenderness.  There is evidence of previous mastoplasty scars GI: The abdomen is soft,  and nondistended. There are no palpable masses. There is no hepatosplenomegaly. There are normal bowel sounds in all quadrants. She does have mild tenderness to palpation in the right suprapubic area without any significant hernias.  No peritonitis GU: Rectal deferred.   MUSCULOSKELETAL: Normal muscle strength and tone. No cyanosis or edema.   SKIN: Turgor is good and there are no pathologic skin lesions or ulcers. NEUROLOGIC: Motor and sensation is grossly normal. Cranial nerves are grossly intact. PSYCH:  Oriented to person, place and time. Affect is normal.  Data Reviewed  I have personally reviewed the patient's imaging, laboratory findings and medical records.    Assessment/Plan Persistent nipple discharge on the left side.  I had an extensive discussion with patient regarding the natural history of nipple discharge.  Currently there is no evidence of any pathological lesions on imaging studies.  I discussed different options including perform an open MRI to make sure there is no other specific areas that may need biopsy versus central duct excision versus observation with another ultrasound and mammogram in a short period of time.  The patient is contemplating a future breast reduction surgery and at this point she wishes to proceed with bilateral breast MRI.  I will see her after she completes those studies.  A copy of this report was sent to the referring provider  Caroleen Hamman, MD FACS General Surgeon 07/06/2019, 6:28 PM

## 2019-07-18 ENCOUNTER — Ambulatory Visit
Admission: RE | Admit: 2019-07-18 | Discharge: 2019-07-18 | Disposition: A | Discharge status: Home / Self Care | Payer: BC Managed Care – PPO | Source: Ambulatory Visit | Attending: Surgery | Admitting: Surgery

## 2019-07-18 ENCOUNTER — Other Ambulatory Visit: Payer: Self-pay

## 2019-07-18 DIAGNOSIS — N6452 Nipple discharge: Secondary | ICD-10-CM | POA: Insufficient documentation

## 2019-07-18 IMAGING — MR MR BILATERAL BREAST WITHOUT AND WITH CONTRAST
2 of 9 series · 6 of 48 positions shown · IV contrast (8ml Gadavist)
Comparison: Previous exam(s).

CLINICAL DATA: Spontaneous left nipple discharge since [REDACTED].
History of bilateral breast reduction mammoplasty.

LABS:  None.
EXAM:
BILATERAL BREAST MRI WITH AND WITHOUT CONTRAST
TECHNIQUE: Multiplanar, multisequence MR images of both breasts were obtained
prior to and following the intravenous administration of 8 ml of
Gadavist

[Series 2: T1 · axial · B · 1.5mm · 1.02mm/px · z∈[-69,+98]mm · 5 of 112 slices shown]
[im 1/112]
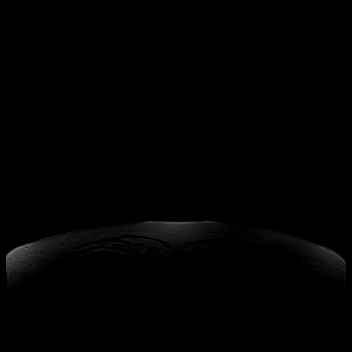
[im 28/112]
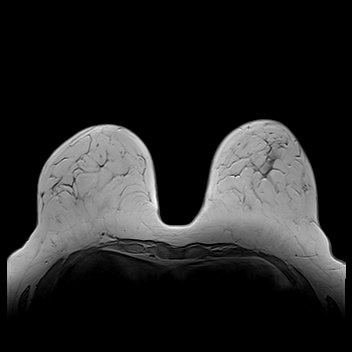
[im 56/112]
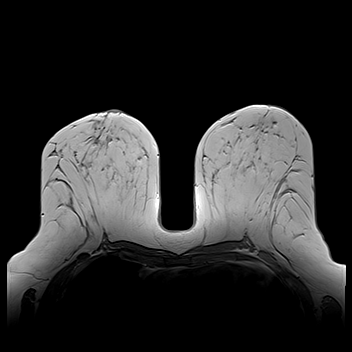
[im 84/112]
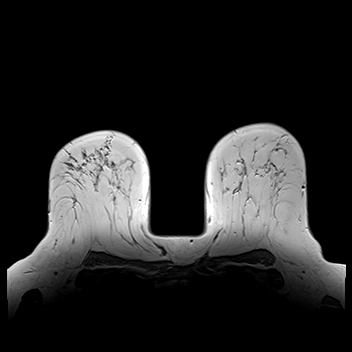
[im 112/112]
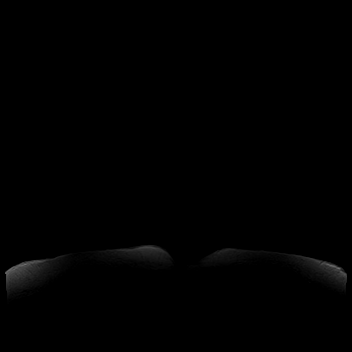

[Series 3: T2 · axial · B · 3.0mm · 1.02mm/px · 1 of 38 slices shown]
[im 1/38]
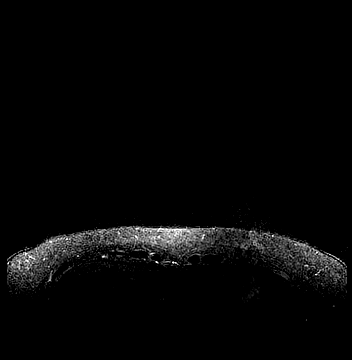

[6 of 48 positions shown; findings below may reference images not displayed]

Three-dimensional MR images were rendered by post-processing of the
original MR data on an independent workstation. The
three-dimensional MR images were interpreted, and findings are
reported in the following complete MRI report for this study. Three
dimensional images were evaluated at the independent [employer]
workstation
FINDINGS: Breast composition: c. Heterogeneous fibroglandular tissue.

Background parenchymal enhancement: Moderate.

Right breast: No mass or abnormal enhancement.

Left breast: There is a 6-7 mm T2 hyperintense septated
progressively enhancing mass in the left 12 o'clock breast, 5 cm
deep to the nipple, image 77/112.

Lymph nodes: No abnormal appearing lymph nodes.

Ancillary findings:  None.
IMPRESSION: No MRI evidence of right breast malignancy.

6-7 mm progressively enhancing septated mass in the left 12 o'clock
breast, 5 cm from the nipple. Although this finding has
predominantly benign characteristics, given the presence of left
nipple discharge, further evaluation with second-look ultrasound is
recommended.

RECOMMENDATION:
Second-look ultrasound of the left breast. If the left breast
ultrasound is unrevealing, then MRI guided core needle biopsy of the
left breast 12 o'clock mass is recommended, to exclude an
intraductal papilloma, or more severe pathology.

BI-RADS CATEGORY  4: Suspicious.

## 2019-07-18 MED ORDER — GADOBUTROL 1 MMOL/ML IV SOLN
8.0000 mL | Freq: Once | INTRAVENOUS | Status: AC | PRN
  Administered 2019-07-18: 10:00:00 8 mL via INTRAVENOUS

## 2019-07-20 ENCOUNTER — Ambulatory Visit (INDEPENDENT_AMBULATORY_CARE_PROVIDER_SITE_OTHER): Payer: BC Managed Care – PPO | Admitting: Surgery

## 2019-07-20 ENCOUNTER — Encounter: Payer: Self-pay | Admitting: Surgery

## 2019-07-20 ENCOUNTER — Other Ambulatory Visit: Payer: Self-pay

## 2019-07-20 VITALS — BP 124/85 | HR 91 | Temp 98.1°F | Ht 65.0 in | Wt 192.4 lb

## 2019-07-20 DIAGNOSIS — N6452 Nipple discharge: Secondary | ICD-10-CM

## 2019-07-20 NOTE — Patient Instructions (Addendum)
The patient is aware to call back for any questions or new concerns.  MRI left breast with left breast biopsy in [facility] August 24 at 7:00 [facility] arrival at 6:45, pt aware and agrees.

## 2019-07-21 ENCOUNTER — Other Ambulatory Visit: Payer: Self-pay | Admitting: Surgery

## 2019-07-21 ENCOUNTER — Encounter: Payer: Self-pay | Admitting: Surgery

## 2019-07-21 DIAGNOSIS — R928 Other abnormal and inconclusive findings on diagnostic imaging of breast: Secondary | ICD-10-CM

## 2019-07-21 NOTE — Progress Notes (Signed)
Outpatient Surgical Follow Up  07/21/2019  Melanie Choi is an 33 y.o. female.   Chief Complaint  Patient presents with  . Follow-up     Follow up: Left breast Nipple discharge- discuss MRI    HPI: Mandates following up for pathological left nipple discharge.  She did have a history of breast reduction close to 10 years ago.  I have personally review the ultrasound and mammogram without evidence of pathological lesions.  We did order an MRI that I also have personally reviewed showing evidence of a slight asymmetry and a small nodule on the left side again at 12:00 and 5 cm from the nipple. He continues to have intermittent left nipple discharge on a daily basis.  Past Medical History:  Diagnosis Date  . Anemia   . Arthritis   . Complication of anesthesia   . Dysmenorrhea   . Family history of adverse reaction to anesthesia    MOM-NAUSEA  . GERD (gastroesophageal reflux disease)    OCC  . History of methicillin resistant staphylococcus aureus (MRSA) 2005  . PONV (postoperative nausea and vomiting)    VERY NAUSEATED  . Seizures ([place])    AGE 27 X1    Past Surgical History:  Procedure Laterality Date  . ABDOMINAL HYSTERECTOMY    . ABLATION    . BREAST SURGERY    . CESAREAN SECTION     X2  . COLONOSCOPY    . LAPAROSCOPIC ASSISTED VAGINAL HYSTERECTOMY N/A 02/15/2018   Procedure: LAPAROSCOPIC ASSISTED VAGINAL HYSTERECTOMY;  Surgeon: Harlin Heys, MD;  Location: [facility] ORS;  Service: Gynecology;  Laterality: N/A;  . LAPAROSCOPIC BILATERAL SALPINGECTOMY Bilateral 02/15/2018   Procedure: LAPAROSCOPIC BILATERAL SALPINGECTOMY;  Surgeon: Harlin Heys, MD;  Location: [facility] ORS;  Service: Gynecology;  Laterality: Bilateral;  . REDUCTION MAMMAPLASTY    . SHOULDER SURGERY Right   . TUBAL LIGATION    . UPPER GI ENDOSCOPY      Family History  Problem Relation Age of Onset  . Thyroid disease Mother   . Heart attack Father   . Breast cancer Maternal Aunt     Social  History:  reports that she has never smoked. She has never used smokeless tobacco. She reports current alcohol use. She reports that she does not use drugs.  Allergies:  Allergies  Allergen Reactions  . Morphine And Related Anaphylaxis  . Percocet [Oxycodone-Acetaminophen] Rash  . Tape Rash    IF LEFT ON FOR LONG PERIODS OF TIME    Medications reviewed.    ROS Full ROS performed and is otherwise negative other than what is stated in HPI   BP 124/85   Pulse 91   Temp 98.1 F (36.7 C) (Temporal)   Ht 5\' 5"  (1.651 m)   Wt 192 lb 6.4 oz (87.3 kg)   LMP 02/04/2018 (Approximate)   SpO2 98%   BMI 32.02 kg/m   Physical Exam General: NAD, alert CHEST: Tenderness to palpation on Left side chest wall. No infection. Nml. resp effort. BREAST: There is no evidence of masses there is normal nipples no evidence of lymphadenopathy.  I was able to expressa drop or 2 of serous discharge coming from a single duct located around 2:00 within the nipple left side.  The palpation is mildly uncomfortable but no evidence of significant tenderness.  There is evidence of previous mastoplasty scars GI: The abdomen is soft,  and nondistended. There are no palpable masses. There is no hepatosplenomegaly. There are normal bowel sounds in all quadrants.  No peritonitis GU: Rectal deferred.   MUSCULOSKELETAL: Normal muscle strength and tone. No cyanosis or edema.   SKIN: Turgor is good and there are no pathologic skin lesions or ulcers. NEUROLOGIC: Motor and sensation is grossly normal. Cranial nerves are grossly intact. PSYCH:  Oriented to person, place and time. Affect is normal.     Assessment/Plan: 33 year old female with persistent left-sided nipple discharge.  MRI with some questionable asymmetry.  Given the persistent discharge I do recommend proceeding with MRI guided biopsy.  I do not necessarily think that an ultrasound will add anything especially given the recent findings.  Discussed with the  patient in detail about my thought process.  She is in complete agreement and also understands that a biopsy will be in her best interest.  We will see her back after she gets her biopsy and will tailor therapy depending on results  Greater than 50% of the 40 minutes  visit was spent in counseling/coordination of care   Caroleen Hamman, MD [place] [facility]

## 2019-07-25 ENCOUNTER — Ambulatory Visit: Payer: BC Managed Care – PPO | Admitting: Surgery

## 2019-07-27 ENCOUNTER — Other Ambulatory Visit: Payer: Self-pay

## 2019-07-27 ENCOUNTER — Ambulatory Visit
Admission: RE | Admit: 2019-07-27 | Discharge: 2019-07-27 | Disposition: A | Discharge status: Home / Self Care | Payer: BC Managed Care – PPO | Source: Ambulatory Visit | Attending: Surgery | Admitting: Surgery

## 2019-07-27 DIAGNOSIS — R928 Other abnormal and inconclusive findings on diagnostic imaging of breast: Secondary | ICD-10-CM

## 2019-07-27 HISTORY — PX: BREAST BIOPSY: SHX20

## 2019-07-27 IMAGING — MR MR BREAST BX W LOC DEV 1ST LESION IMAGE BX SPEC MR GUIDE*L*
6 of 8 series · 32 of 48 positions shown · IV contrast (9 ml gadavist)
Comparison: Previous exams.
COMPARISON: Previous exams.

Addendum:
CLINICAL DATA: Recent MRI demonstrating an indeterminate mass
within the LEFT breast for which MRI guided biopsy is recommended

EXAM:
MRI GUIDED CORE NEEDLE BIOPSY OF THE LEFT BREAST
TECHNIQUE: Multiplanar, multisequence MR imaging of the LEFT breast was
performed both before and after administration of intravenous
contrast.
CONTRAST:  8 cc Gadavist

[Series 2: fiducial unilateral · sagittal · 2.0mm · 1.33mm/px · 1 of 52 slices shown]
[im 1/52]
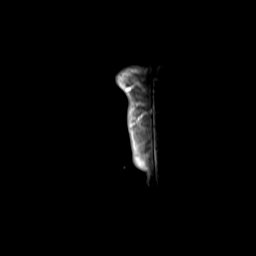

[Series 3: dynamic pre · axial · non-contrast · 1.3mm · 0.73mm/px · z∈[-71,+115]mm · 6 of 144 slices shown]
[im 1/144]
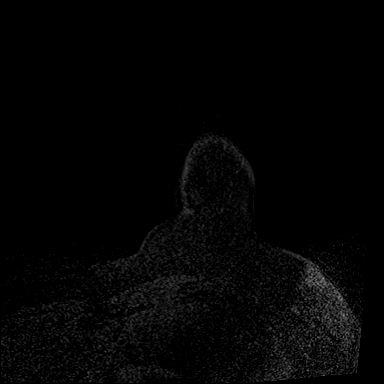
[im 29/144]
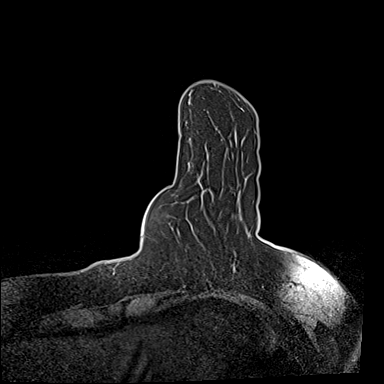
[im 58/144]
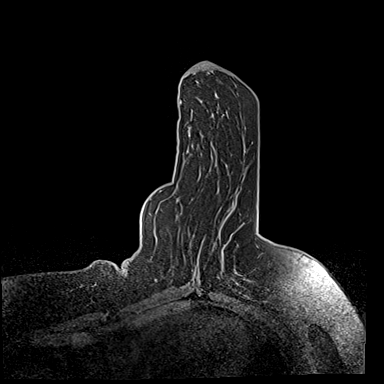
[im 86/144]
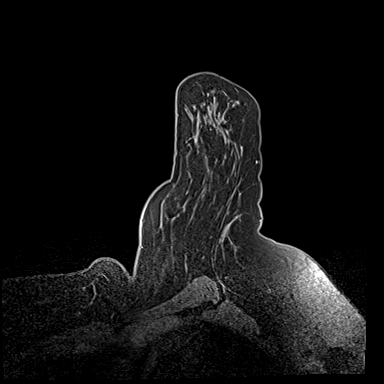
[im 115/144]
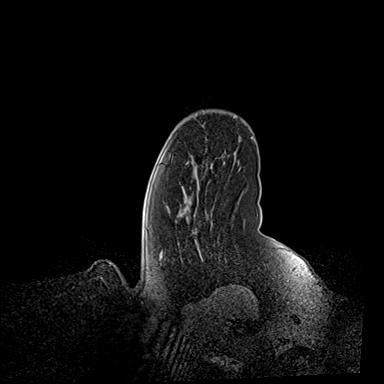
[im 144/144]
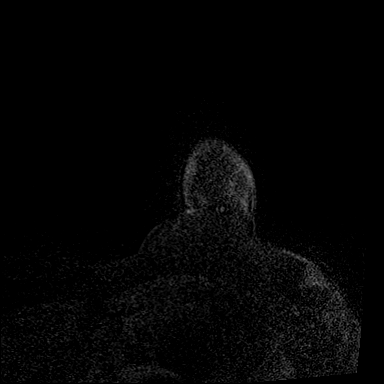

[Series 4: dynamic post 20 · axial · 1.3mm · 0.73mm/px · z∈[-71,+115]mm · 6 of 144 slices shown (1 of 2)]
[im 1/144]
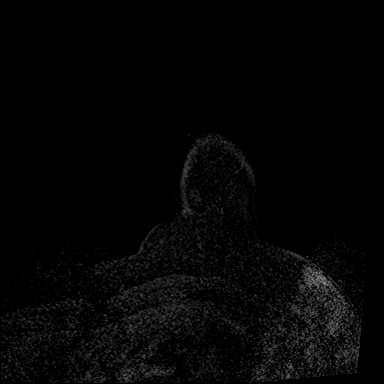
[im 29/144]
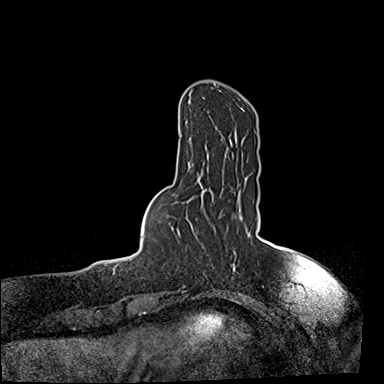
[im 58/144]
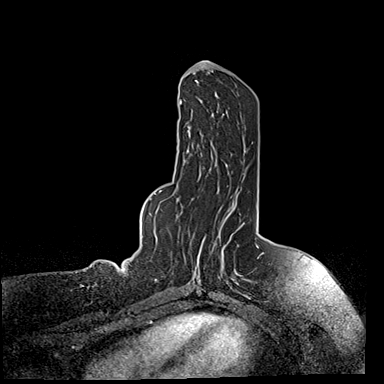
[im 86/144]
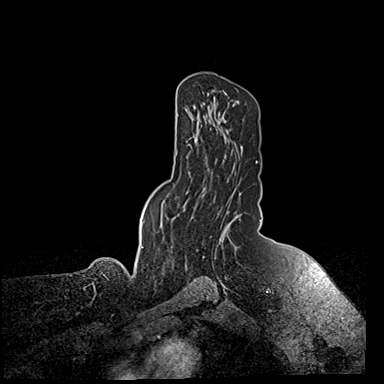
[im 115/144]
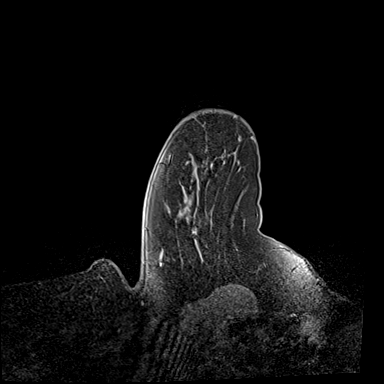
[im 144/144]
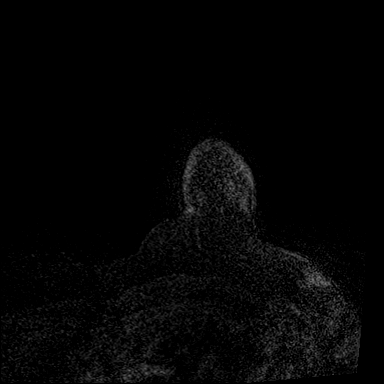

[Series 5: dynamic post 20 · axial · 1.3mm · 0.73mm/px · z∈[-71,+115]mm · 7 of 144 slices shown (2 of 2)]
[im 1/144]
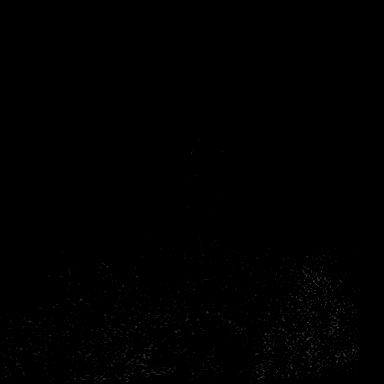
[im 24/144]
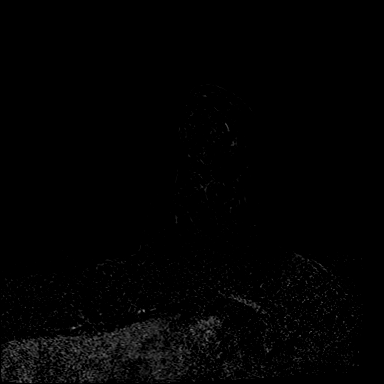
[im 48/144]
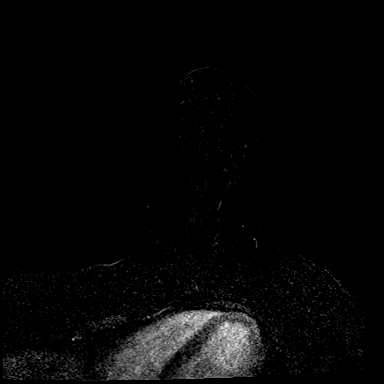
[im 72/144]
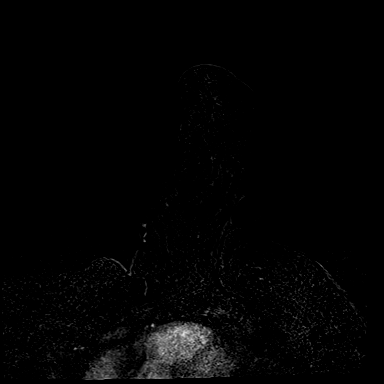
[im 96/144]
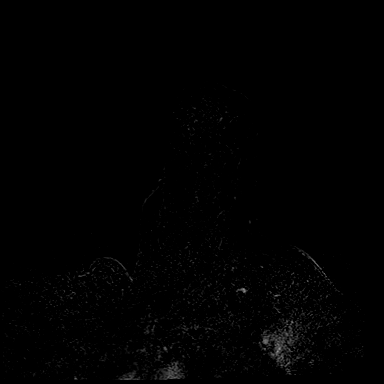
[im 120/144]
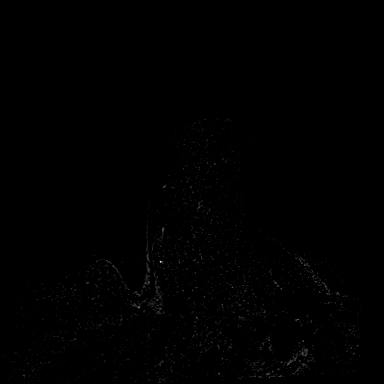
[im 144/144]
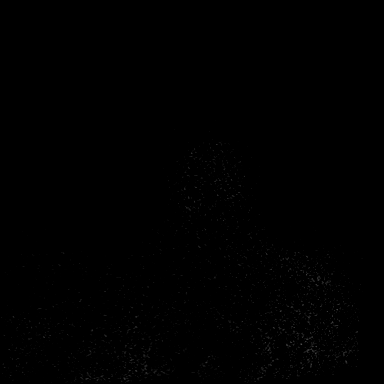

[Series 6: dynamic post 3 · axial · 1.3mm · 0.73mm/px · z∈[-71,+115]mm · 7 of 144 slices shown (1 of 2)]
[im 1/144]
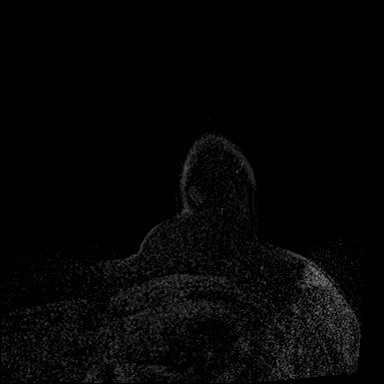
[im 24/144]
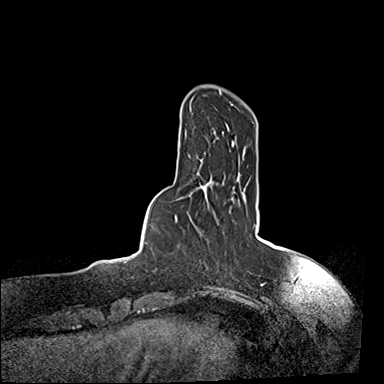
[im 48/144]
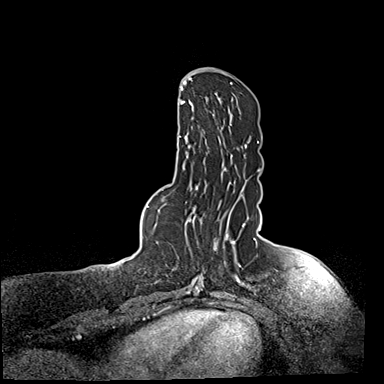
[im 72/144]
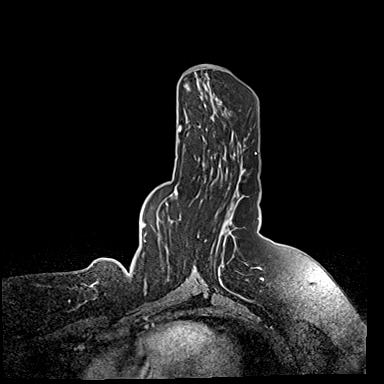
[im 96/144]
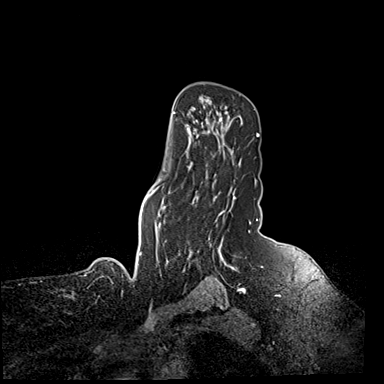
[im 120/144]
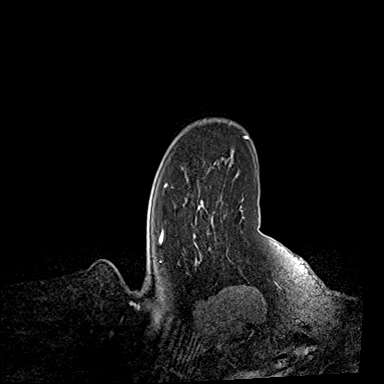
[im 144/144]
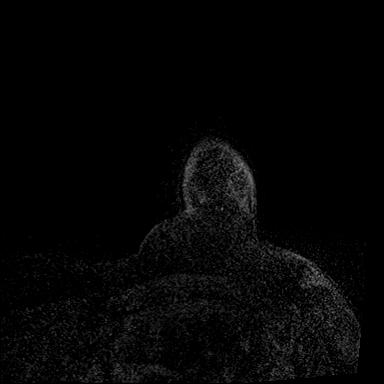

[Series 7: dynamic post 3 · axial · 1.3mm · 0.73mm/px · z∈[-71,+52]mm · 5 of 144 slices shown (2 of 2)]
[im 1/144]
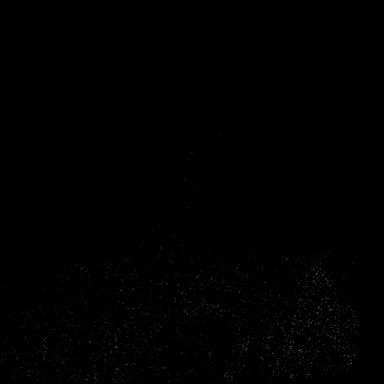
[im 24/144]
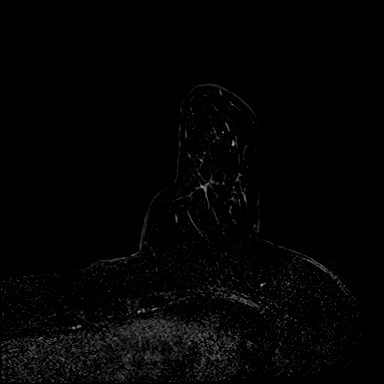
[im 48/144]
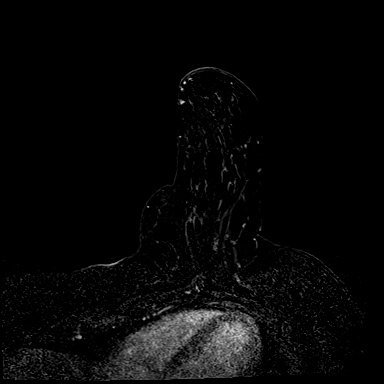
[im 72/144]
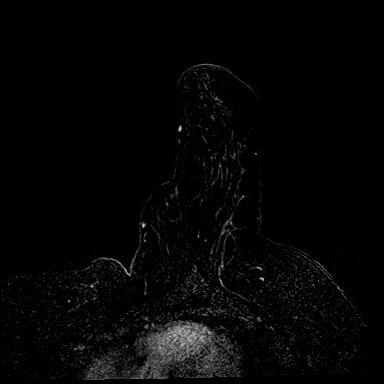
[im 96/144]
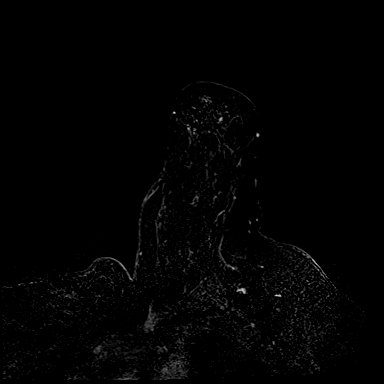

[32 of 48 positions shown; findings below may reference images not displayed]

FINDINGS: I met with the patient, and we discussed the procedure of MRI guided
biopsy, including risks, benefits, and alternatives. Specifically,
we discussed the risks of infection, bleeding, tissue injury, clip
migration, and inadequate sampling. Informed, written consent was
given. The usual time out protocol was performed immediately prior
to the procedure.

Using sterile technique, 1% Lidocaine, MRI guidance, and a 9 gauge
vacuum assisted device, biopsy was performed of the indeterminate
mass within the LEFT breast at the 12 o'clock axis using a lateral
approach. At the conclusion of the procedure, a dumbbell shaped
tissue marker clip was deployed into the biopsy cavity. Follow-up
2-view mammogram was performed and dictated separately.
IMPRESSION: MRI guided biopsy of the LEFT breast mass at the 12 o'clock axis. No
apparent complications.

ADDENDUM:
Pathology revealed FIBROCYSTIC CHANGE of the LEFT breast, 12
o'clock. This was found to be concordant by Dr. DYLON.

Pathology results were discussed with the patient by telephone. The
patient reported doing well after the biopsy with tenderness at the
site. Post biopsy instructions and care were reviewed and questions
were answered. The patient was encouraged to call The [REDACTED]

The patient was instructed to return for bilateral breast MRI in 6
months per protocol and consider central duct excision if suspicious
left-sided nipple discharge persists.

Pathology results reported by DYLON, RN on [DATE].

*** End of Addendum ***
FINDINGS: I met with the patient, and we discussed the procedure of MRI guided
biopsy, including risks, benefits, and alternatives. Specifically,
we discussed the risks of infection, bleeding, tissue injury, clip
migration, and inadequate sampling. Informed, written consent was
given. The usual time out protocol was performed immediately prior
to the procedure.

Using sterile technique, 1% Lidocaine, MRI guidance, and a 9 gauge
vacuum assisted device, biopsy was performed of the indeterminate
mass within the LEFT breast at the 12 o'clock axis using a lateral
approach. At the conclusion of the procedure, a dumbbell shaped
tissue marker clip was deployed into the biopsy cavity. Follow-up
2-view mammogram was performed and dictated separately.
IMPRESSION: MRI guided biopsy of the LEFT breast mass at the 12 o'clock axis. No
apparent complications.

## 2019-07-27 IMAGING — MG MM CLIP PLACEMENT
4 series · 4 of 12 positions shown · non-contrast
Comparison: Previous exam(s).

CLINICAL DATA: Status post MRI guided biopsy for an indeterminate
mass within the upper LEFT breast.

EXAM:
DIAGNOSTIC LEFT MAMMOGRAM POST MRI BIOPSY

[L ML synth-2D]
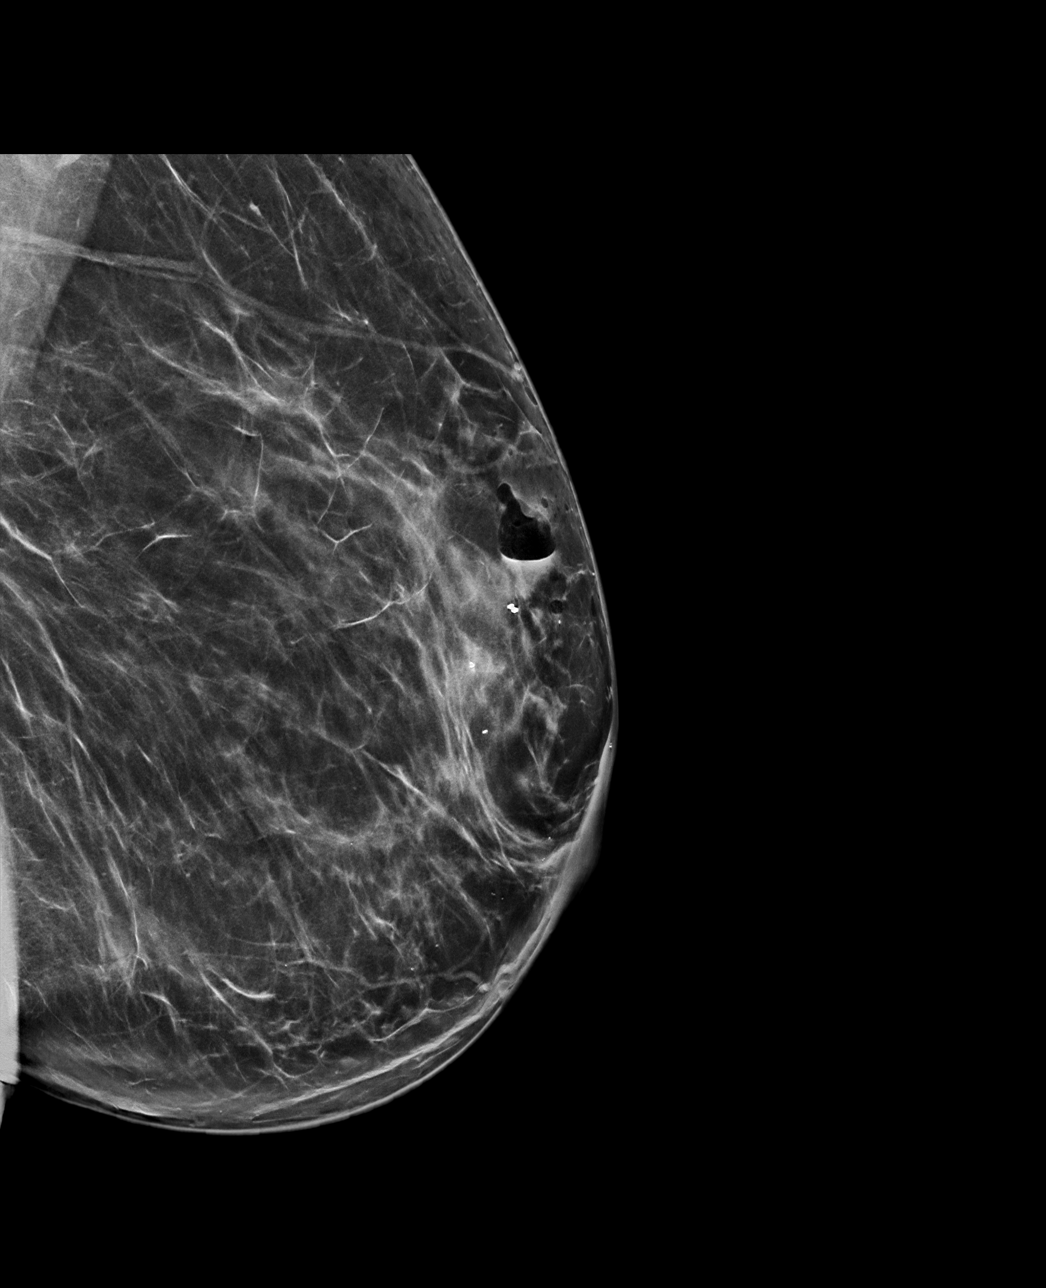

[L CC synth-2D]
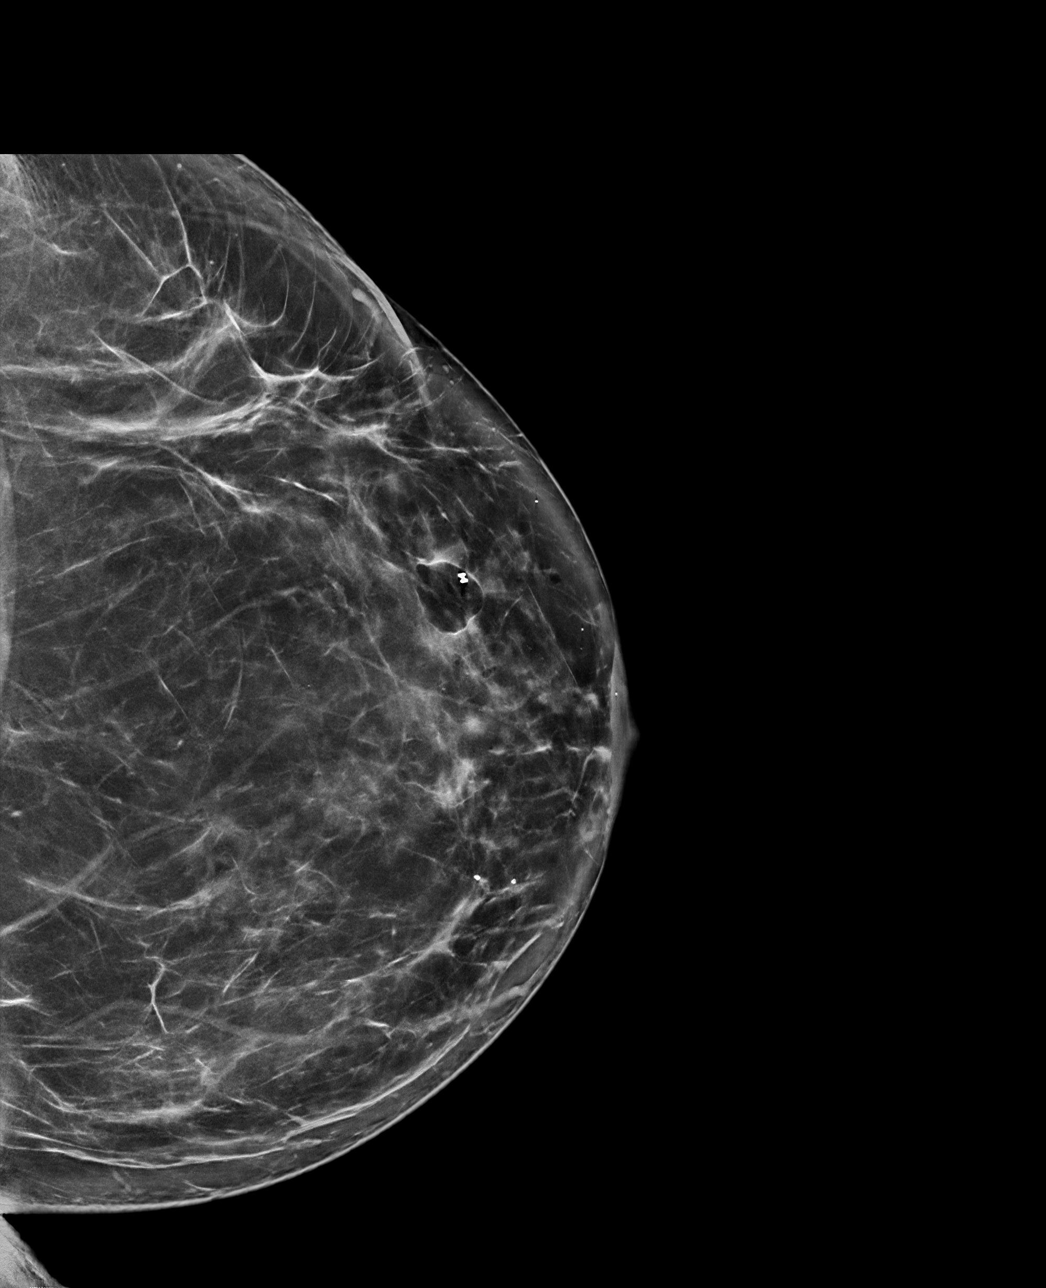

[L CC tomo · tomo slice 45/88.0]
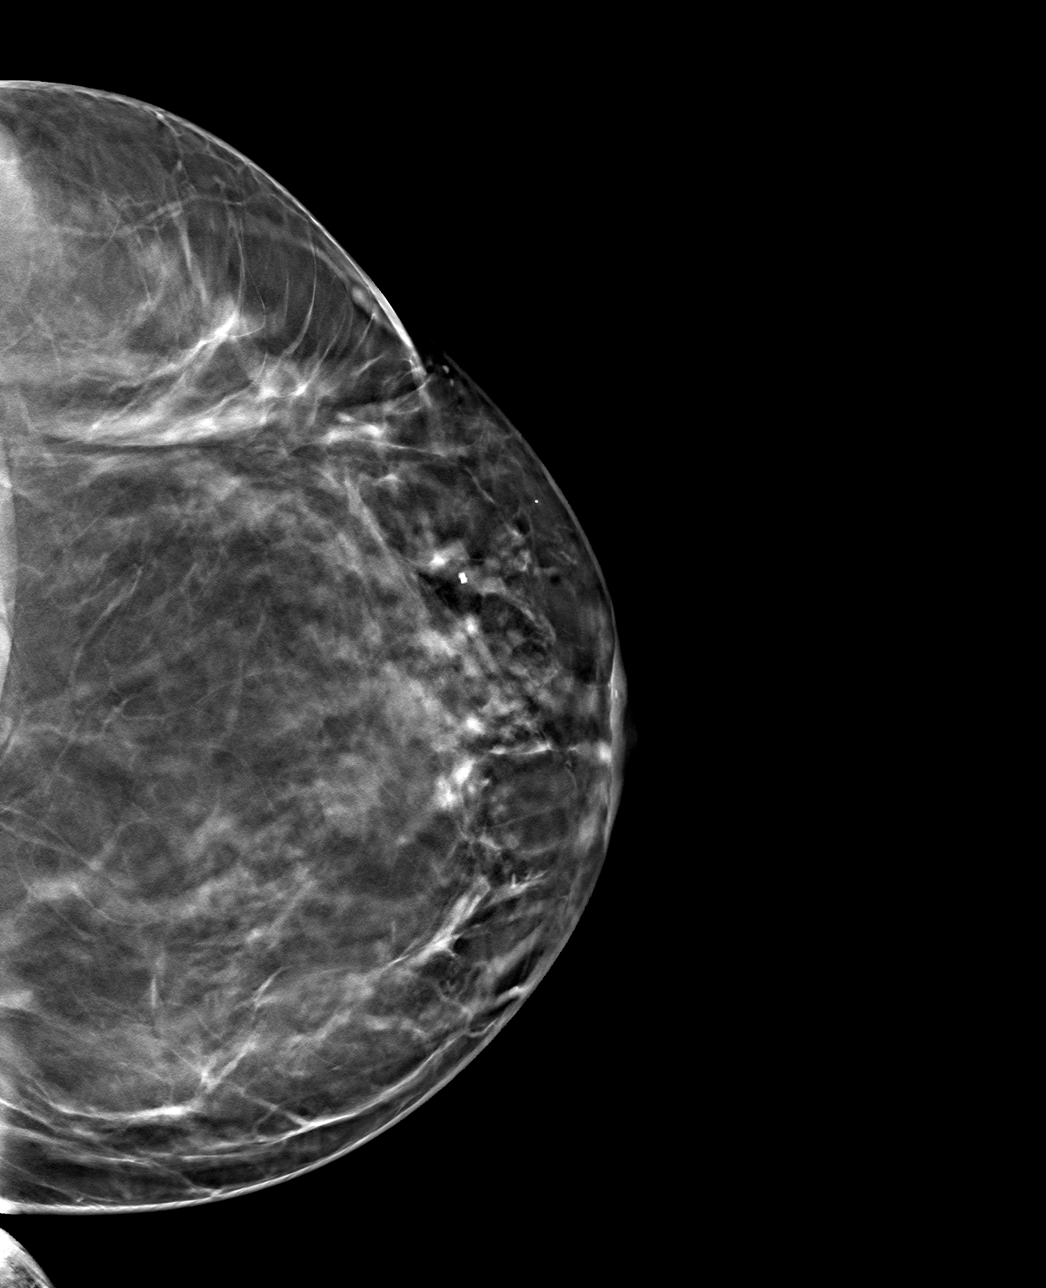

[L ML tomo · tomo slice 42/83.0]
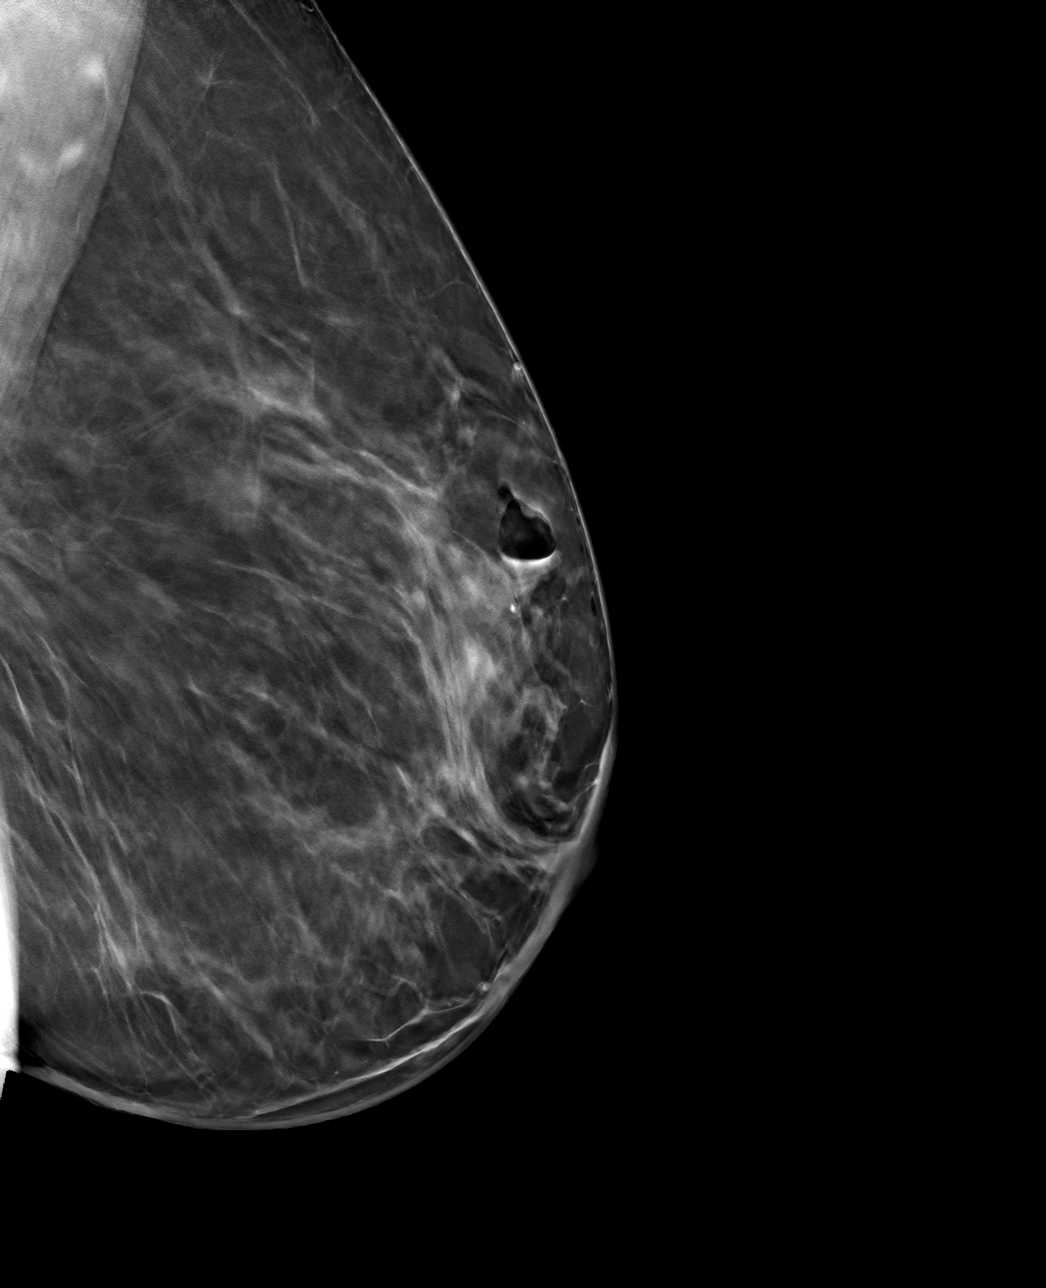

[4 of 12 positions shown; findings below may reference images not displayed]

FINDINGS: Mammographic images were obtained following MRI guided biopsy of an
indeterminate enhancing mass within the upper LEFT breast.
Dumbbell-shaped is positioned approximately 1 cm lateral to the 12
o'clock axis corresponding to the targeted mass.
IMPRESSION: Dumbbell shaped clip is displaced approximately 1 cm lateral to the
12 o'clock axis corresponding to the site of patient's targeted
mass.

Final Assessment: Post Procedure Mammograms for Marker Placement

## 2019-07-27 MED ORDER — GADOBUTROL 1 MMOL/ML IV SOLN
8.0000 mL | Freq: Once | INTRAVENOUS | Status: AC | PRN
  Administered 2019-07-27: 8 mL via INTRAVENOUS

## 2019-08-01 ENCOUNTER — Other Ambulatory Visit: Payer: BC Managed Care – PPO

## 2019-08-02 HISTORY — PX: COLONOSCOPY: SHX174

## 2019-08-15 ENCOUNTER — Ambulatory Visit (INDEPENDENT_AMBULATORY_CARE_PROVIDER_SITE_OTHER): Payer: BC Managed Care – PPO | Admitting: Surgery

## 2019-08-15 ENCOUNTER — Encounter: Payer: Self-pay | Admitting: Surgery

## 2019-08-15 ENCOUNTER — Other Ambulatory Visit: Payer: Self-pay

## 2019-08-15 ENCOUNTER — Ambulatory Visit: Payer: BC Managed Care – PPO | Admitting: Surgery

## 2019-08-15 VITALS — BP 135/85 | HR 90 | Temp 97.7°F | Resp 16 | Ht 65.0 in | Wt 191.2 lb

## 2019-08-15 DIAGNOSIS — N6452 Nipple discharge: Secondary | ICD-10-CM

## 2019-08-15 NOTE — Progress Notes (Signed)
Outpatient Surgical Follow Up  08/15/2019  Melanie Choi is an 33 y.o. female.   Chief Complaint  Patient presents with  . Routine Post Op    Left breast biopsy    HPI: Melanie Choi is a 33 y.o. female well-known to me following for nipple discharge and recent MRI guided biopsy.  Please note that personal review the MRI as well as the biopsy.  Changes consistent with fibrocystic disease.  She reports that her nipple discharge have subsided.  There is some very occasional mild mastalgia on the left side but for up for the most part has resolved as well No complications related to the biopsy.  Past Medical History:  Diagnosis Date  . Anemia   . Arthritis   . Complication of anesthesia   . Dysmenorrhea   . Family history of adverse reaction to anesthesia    MOM-NAUSEA  . GERD (gastroesophageal reflux disease)    OCC  . History of methicillin resistant staphylococcus aureus (MRSA) 2005  . PONV (postoperative nausea and vomiting)    VERY NAUSEATED  . Seizures ([place])    AGE 1 X1    Past Surgical History:  Procedure Laterality Date  . ABDOMINAL HYSTERECTOMY    . ABLATION    . BREAST BIOPSY Left 07/27/2019  . BREAST SURGERY    . CESAREAN SECTION     X2  . COLONOSCOPY    . LAPAROSCOPIC ASSISTED VAGINAL HYSTERECTOMY N/A 02/15/2018   Procedure: LAPAROSCOPIC ASSISTED VAGINAL HYSTERECTOMY;  Surgeon: Harlin Heys, MD;  Location: [facility] ORS;  Service: Gynecology;  Laterality: N/A;  . LAPAROSCOPIC BILATERAL SALPINGECTOMY Bilateral 02/15/2018   Procedure: LAPAROSCOPIC BILATERAL SALPINGECTOMY;  Surgeon: Harlin Heys, MD;  Location: [facility] ORS;  Service: Gynecology;  Laterality: Bilateral;  . REDUCTION MAMMAPLASTY    . SHOULDER SURGERY Right   . TUBAL LIGATION    . UPPER GI ENDOSCOPY      Family History  Problem Relation Age of Onset  . Thyroid disease Mother   . Heart attack Father   . Breast cancer Maternal Aunt     Social History:  reports that she has never smoked.  She has never used smokeless tobacco. She reports current alcohol use. She reports that she does not use drugs.  Allergies:  Allergies  Allergen Reactions  . Morphine And Related Anaphylaxis  . Percocet [Oxycodone-Acetaminophen] Rash  . Tape Rash    IF LEFT ON FOR LONG PERIODS OF TIME    Medications reviewed.    ROS Full ROS performed and is otherwise negative other than what is stated in HPI   BP 135/85   Pulse 90   Temp 97.7 F (36.5 C) (Temporal)   Resp 16   Ht 5\' 5"  (1.651 m)   Wt 191 lb 3.2 oz (86.7 kg)   LMP 02/04/2018 (Approximate)   SpO2 98%   BMI 31.82 kg/m   Physical Exam Vitals signs and nursing note reviewed. Exam conducted with a chaperone present.  Constitutional:      General: She is not in acute distress.    Appearance: Normal appearance. She is normal weight.  Pulmonary:     Effort: Pulmonary effort is normal. No respiratory distress.     Breath sounds: No stridor.     Comments: BREAST; small healing biopsy site.  No infection.  No discrete masses.  No nipple discharges.  No evidence of masses or discrete lesions on either breast.  No evidence of lymphadenopathy. Skin:    General: Skin is warm  and dry.     Capillary Refill: Capillary refill takes less than 2 seconds.     Coloration: Skin is not jaundiced.  Neurological:     General: No focal deficit present.     Mental Status: She is alert and oriented to person, place, and time.  Psychiatric:        Mood and Affect: Mood normal.        Behavior: Behavior normal.        Thought Content: Thought content normal.        Judgment: Judgment normal.      Assessment/Plan: Left breast asymmetry consistent with fibrocystic changes and nipple discharge.  From nipple discharge she has improved.  We will follow her up in 6 months with a physical exam and MRI per protocol.  No need for surgical intervention at this time  Greater than 50% of the 25 minutes  visit was spent in counseling/coordination of  care   Caroleen Hamman, MD [place] [facility]

## 2019-08-15 NOTE — Patient Instructions (Signed)
WE will call you in February to schedule MRI Breast and follow up anointment with Dr.Pabon.

## 2019-09-28 ENCOUNTER — Ambulatory Visit
Admission: EM | Admit: 2019-09-28 | Discharge: 2019-09-28 | Disposition: A | Discharge status: Home / Self Care | Payer: BC Managed Care – PPO | Attending: Family Medicine | Admitting: Family Medicine

## 2019-09-28 ENCOUNTER — Encounter: Payer: Self-pay | Admitting: Emergency Medicine

## 2019-09-28 ENCOUNTER — Other Ambulatory Visit: Payer: Self-pay

## 2019-09-28 DIAGNOSIS — S29019A Strain of muscle and tendon of unspecified wall of thorax, initial encounter: Secondary | ICD-10-CM | POA: Diagnosis not present

## 2019-09-28 DIAGNOSIS — X509XXA Other and unspecified overexertion or strenuous movements or postures, initial encounter: Secondary | ICD-10-CM

## 2019-09-28 MED ORDER — KETOROLAC TROMETHAMINE 60 MG/2ML IM SOLN
60.0000 mg | Freq: Once | INTRAMUSCULAR | Status: AC
  Administered 2019-09-28: 15:00:00 60 mg via INTRAMUSCULAR

## 2019-09-28 MED ORDER — CYCLOBENZAPRINE HCL 10 MG PO TABS: 10.0000 mg | ORAL_TABLET | Freq: Two times a day (BID) | ORAL | 0 refills | Status: DC | PRN

## 2019-09-28 MED ORDER — MELOXICAM 15 MG PO TABS: 15.0000 mg | ORAL_TABLET | Freq: Every day | ORAL | 0 refills | Status: DC | PRN

## 2019-09-28 NOTE — Discharge Instructions (Addendum)
Take medication as prescribed. Rest. Ice/heat. Stretch.   Follow up with your primary care physician this week as needed. Return to Urgent care for new or worsening concerns.

## 2019-09-28 NOTE — ED Provider Notes (Signed)
[facility] ____________________________________________  Time seen: Approximately 3:33 PM  I have reviewed the triage vital signs and the nursing notes.   HISTORY  Chief Complaint Back Pain  HPI Melanie Choi is a 33 y.o. female presenting for evaluation of upper back pain since this morning.  Patient reports while lying down in bed this morning she felt fine, states as she pushed to get up out of bed and somewhat of a awkward manner she felt a pop in her upper back with associated pain.  States if she is standing still or sitting completely still she has minimal pain, describes pain is mostly with movement and a catching sensation.  States she has a history of similar in the past where she "flares "her back pain up.  Denies any direct fall or direct injury.  Denies pain ration, paresthesias, chest pain or shortness of breath, fevers or recent sickness.  Did take 2 Aleve earlier without resolution.  Denies other aggravating alleviating factors.   Patient's last menstrual period was 02/04/2018 (approximate).  Hysterectomy.    Past Medical History:  Diagnosis Date  . Anemia   . Arthritis   . Complication of anesthesia   . Dysmenorrhea   . Family history of adverse reaction to anesthesia    MOM-NAUSEA  . GERD (gastroesophageal reflux disease)    OCC  . History of methicillin resistant staphylococcus aureus (MRSA) 2005  . PONV (postoperative nausea and vomiting)    VERY NAUSEATED  . Seizures ([place])    AGE 86 X1    Patient Active Problem List   Diagnosis Date Noted  . Pendulous breast 04/13/2018  . Post-operative state 02/15/2018  . Chronic midline low back pain without sciatica 01/12/2018  . Chronic midline thoracic back pain 01/12/2018  . GERD without esophagitis 01/12/2018  . High cholesterol 01/12/2018  . History of peptic ulcer 01/12/2018  . Separation of left acromioclavicular joint 08/30/2017    Past Surgical History:  Procedure Laterality Date  .  ABDOMINAL HYSTERECTOMY    . ABLATION    . BREAST BIOPSY Left 07/27/2019  . BREAST SURGERY    . CESAREAN SECTION     X2  . COLONOSCOPY    . LAPAROSCOPIC ASSISTED VAGINAL HYSTERECTOMY N/A 02/15/2018   Procedure: LAPAROSCOPIC ASSISTED VAGINAL HYSTERECTOMY;  Surgeon: Harlin Heys, MD;  Location: [facility] OR[facility];  Service: Gynecology;  Laterality: N/A;  . LAPAROSCOPIC BILATERAL SALPINGECTOMY Bilateral 02/15/2018   Procedure: LAPAROSCOPIC BILATERAL SALPINGECTOMY;  Surgeon: Harlin Heys, MD;  Location: [facility];  Service: Gynecology;  Laterality: Bilateral;  . REDUCTION MAMMAPLASTY    . SHOULDER SURGERY Right   . TUBAL LIGATION    . UPPER GI ENDOSCOPY       No current facility-administered medications for this encounter.   Current Outpatient Medications:  .  clonazePAM (KLONOPIN) 0.5 MG tablet, Take by mouth., Disp: , Rfl:  .  Multiple Vitamin (MULTI-VITAMIN) tablet, Take by mouth., Disp: , Rfl:  .  predniSONE (STERAPRED UNI-PAK 21 TAB) 10 MG (21) TBPK tablet, Take by mouth daily., Disp: , Rfl:  .  clobetasol ointment (TEMOVATE) 0.05 %, , Disp: , Rfl:  .  cyclobenzaprine (FLEXERIL) 10 MG tablet, Take 1 tablet (10 mg total) by mouth 2 (two) times daily as needed for muscle spasms. Do not drive while taking as can cause drowsiness, Disp: 15 tablet, Rfl: 0 .  GAVILYTE-N WITH FLAVOR PACK 420 g solution, , Disp: , Rfl:  .  meloxicam (MOBIC) 15 MG tablet, Take 1 tablet (15  mg total) by mouth daily as needed., Disp: 10 tablet, Rfl: 0 .  polyethylene glycol-electrolytes (NULYTELY/GOLYTELY) 420 g solution, Take as directed for colonic prep., Disp: , Rfl:   Allergies Morphine and related, Percocet [oxycodone-acetaminophen], and Tape  Family History  Problem Relation Age of Onset  . Thyroid disease Mother   . Heart attack Father   . Breast cancer Maternal Aunt     Social History Social History   Tobacco Use  . Smoking status: Never Smoker  . Smokeless tobacco: Never Used  Substance Use  Topics  . Alcohol use: Yes    Frequency: Never    Comment: WINE OCC  . Drug use: No    Review of Systems Constitutional: No fever Cardiovascular: Denies chest pain. Respiratory: Denies shortness of breath. Gastrointestinal: No abdominal pain.  No nausea, no vomiting.   Genitourinary: Negative for dysuria. Musculoskeletal: Positive for back pain. Skin: Negative for rash. Neurological: Negative for headaches, focal weakness or numbness.   ____________________________________________   PHYSICAL EXAM:  VITAL SIGNS: ED Triage Vitals  Enc Vitals Group     BP 09/28/19 1441 136/83     Pulse Rate 09/28/19 1441 75     Resp 09/28/19 1441 18     Temp 09/28/19 1441 98.4 F (36.9 C)     Temp Source 09/28/19 1441 Oral     SpO2 09/28/19 1441 100 %     Weight 09/28/19 1439 185 lb (83.9 kg)     Height 09/28/19 1439 5\' 5"  (1.651 m)     Head Circumference --      Peak Flow --      Pain Score 09/28/19 1439 7     Pain Loc --      Pain Edu? --      Excl. in [place]? --     Constitutional: Alert and oriented. Well appearing and in no acute distress. Eyes: Conjunctivae are normal.  ENT      Head: Normocephalic and atraumatic. Cardiovascular: Normal rate, regular rhythm. Grossly normal heart sounds.  Good peripheral circulation. Respiratory: Normal respiratory effort without tachypnea nor retractions. Breath sounds are clear and equal bilaterally. No wheezes, rales, rhonchi. Musculoskeletal: Steady.  No paresthesias.  No cervical tenderness palpation and no lumbar tenderness palpation.  Patient with mild midline mid to upper thoracic and parathoracic tenderness palpation, full range of motion present, mild pain with cervical rotation, no rash, no edema. Neurologic:  Normal speech and language. No gross focal neurologic deficits are appreciated. Speech is normal. No gait instability.  Skin:  Skin is warm, dry and intact. No rash noted. Psychiatric: Mood and affect are normal. Speech and behavior  are normal. Patient exhibits appropriate insight and judgment   ___________________________________________   LABS (all labs ordered are listed, but only abnormal results are displayed)  Labs Reviewed - No data to display  PROCEDURES Procedures    INITIAL IMPRESSION / ASSESSMENT AND PLAN / ED COURSE  Pertinent labs & imaging results that were available during my care of the patient were reviewed by me and considered in my medical decision making (see chart for details).  Well-appearing patient.  No acute distress.  Upper back pain without direct injury.  Suspect muscle strain injury.  Will treat with Mobic and Flexeril.  60 mg Toradol given once in urgent care.  Supportive care.Discussed indication, risks and benefits of medications with patient.  Discussed follow up with Primary care physician this week. Discussed follow up and return parameters including no resolution or any worsening concerns.  Patient verbalized understanding and agreed to plan.   ____________________________________________   FINAL CLINICAL IMPRESSION(S) / ED DIAGNOSES  Final diagnoses:  Thoracic myofascial strain, initial encounter     ED Discharge Orders         Ordered    meloxicam (MOBIC) 15 MG tablet  Daily PRN     09/28/19 1502    cyclobenzaprine (FLEXERIL) 10 MG tablet  2 times daily PRN     09/28/19 1502           Note: This dictation was prepared with Dragon dictation along with smaller phrase technology. Any transcriptional errors that result from this process are unintentional.         Marylene Land, NP 09/28/19 1548

## 2019-09-28 NOTE — ED Triage Notes (Signed)
Patient states she was getting out of bed this morning and she felt her upper mid back pop and she has had pain since.

## 2019-12-14 ENCOUNTER — Ambulatory Visit
Admission: EM | Admit: 2019-12-14 | Discharge: 2019-12-14 | Disposition: A | Discharge status: Home / Self Care | Payer: 59 | Attending: Emergency Medicine | Admitting: Emergency Medicine

## 2019-12-14 ENCOUNTER — Encounter: Payer: Self-pay | Admitting: Emergency Medicine

## 2019-12-14 ENCOUNTER — Other Ambulatory Visit: Payer: Self-pay

## 2019-12-14 DIAGNOSIS — S46812A Strain of other muscles, fascia and tendons at shoulder and upper arm level, left arm, initial encounter: Secondary | ICD-10-CM

## 2019-12-14 DIAGNOSIS — M542 Cervicalgia: Secondary | ICD-10-CM | POA: Diagnosis not present

## 2019-12-14 MED ORDER — KETOROLAC TROMETHAMINE 60 MG/2ML IM SOLN
60.0000 mg | Freq: Once | INTRAMUSCULAR | Status: AC
  Administered 2019-12-14: 60 mg via INTRAMUSCULAR

## 2019-12-14 MED ORDER — CYCLOBENZAPRINE HCL 10 MG PO TABS: 10.0000 mg | ORAL_TABLET | Freq: Two times a day (BID) | ORAL | 0 refills | Status: DC | PRN

## 2019-12-14 MED ORDER — MELOXICAM 15 MG PO TABS: 15.0000 mg | ORAL_TABLET | Freq: Every day | ORAL | 0 refills | Status: DC | PRN

## 2019-12-14 NOTE — Discharge Instructions (Addendum)
Take medication as prescribed. Rest. Drink plenty of fluids. Ice/Heat.  Follow up with your primary care physician this week as needed. Return to Urgent care for new or worsening concerns.

## 2019-12-14 NOTE — ED Triage Notes (Signed)
Patient in today c/o neck and left shoulder pain since yesterday. 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 She took Flexeril last night without relief.

## 2019-12-14 NOTE — ED Provider Notes (Signed)
[facility] ____________________________________________  Time seen: Approximately 11:40 AM  I have reviewed the triage vital signs and the nursing notes.   HISTORY  Chief Complaint Neck Pain (APPT) and Shoulder Pain   HPI Melanie Choi is a 34 y.o. female presenting for evaluation of left neck to shoulder pain since yesterday afternoon.  Reports pain was worse this morning.  States pain with any neck movement.  Pain improves with keeping her neck completely still in midline.  Denies any fall, injury or known trigger.  Does report she has recurrent back issues with some similar presentation.  Denies pain radiation, paresthesias, chest pain, shortness of breath, fevers, recent sickness.  Did take one Flexeril yesterday and 2 Aleve this morning without resolution.  Does report in the past Toradol injections has helped.  Reports otherwise doing well.    Past Medical History:  Diagnosis Date  . Anemia   . Arthritis   . Complication of anesthesia   . Dysmenorrhea   . Family history of adverse reaction to anesthesia    MOM-NAUSEA  . GERD (gastroesophageal reflux disease)    OCC  . History of methicillin resistant staphylococcus aureus (MRSA) 2005  . PONV (postoperative nausea and vomiting)    VERY NAUSEATED  . 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 Pendulous breast 04/13/2018  . Post-operative state 02/15/2018  . Chronic midline low back pain without sciatica 01/12/2018  . Chronic midline thoracic back pain 01/12/2018  . GERD without esophagitis 01/12/2018  . High cholesterol 01/12/2018  . History of peptic ulcer 01/12/2018  . Separation of left acromioclavicular joint 08/30/2017    Past Surgical History:  Procedure Laterality Date  . ABDOMINAL HYSTERECTOMY    . ABLATION    . BREAST BIOPSY Left 07/27/2019  . BREAST SURGERY    . CESAREAN SECTION     X2  . COLONOSCOPY    . LAPAROSCOPIC ASSISTED VAGINAL  HYSTERECTOMY N/A 02/15/2018   Procedure: LAPAROSCOPIC ASSISTED VAGINAL HYSTERECTOMY;  Surgeon: Harlin Heys, MD;  Location: [facility] ORS;  Service: Gynecology;  Laterality: N/A;  . LAPAROSCOPIC BILATERAL SALPINGECTOMY Bilateral 02/15/2018   Procedure: LAPAROSCOPIC BILATERAL SALPINGECTOMY;  Surgeon: Harlin Heys, MD;  Location: [facility] ORS;  Service: Gynecology;  Laterality: Bilateral;  . REDUCTION MAMMAPLASTY    . SHOULDER SURGERY Right   . TUBAL LIGATION    . UPPER GI ENDOSCOPY       No current facility-administered medications for this encounter.  Current Outpatient Medications:  .  clonazePAM (KLONOPIN) 0.5 MG tablet, Take by mouth., Disp: , Rfl:  .  Multiple Vitamin (MULTI-VITAMIN) tablet, Take by mouth., Disp: , Rfl:  .  cyclobenzaprine (FLEXERIL) 10 MG tablet, Take 1 tablet (10 mg total) by mouth 2 (two) times daily as needed for muscle spasms. Do not drive while taking as can cause drowsiness, Disp: 15 tablet, Rfl: 0 .  meloxicam (MOBIC) 15 MG tablet, Take 1 tablet (15 mg total) by mouth daily as needed., Disp: 10 tablet, Rfl: 0  Allergies Morphine and related, Percocet [oxycodone-acetaminophen], and Tape  Family History  Problem Relation Age of Onset  . Thyroid disease Mother   . Heart attack Father   . Hyperlipidemia Father   . Breast cancer Maternal Aunt     Social History Social History   Tobacco Use  . Smoking status: Never Smoker  . Smokeless tobacco: Never Used  Substance Use Topics  . Alcohol use:  Yes    Comment: WINE OCC  . Drug use: No    Review of Systems Constitutional: No fever ENT: No sore throat. Cardiovascular: Denies chest pain. Respiratory: Denies shortness of breath. Gastrointestinal: No abdominal pain. Musculoskeletal: Positive neck and shoulder pain as above. Skin: Negative for rash. Neurological: Negative for focal weakness or numbness.   ____________________________________________   PHYSICAL EXAM:  VITAL SIGNS: ED Triage  Vitals [12/14/19 1114]  Enc Vitals Group     BP 122/70     Pulse Rate 85     Resp 18     Temp 98.5 F (36.9 C)     Temp Source Oral     SpO2 100 %     Weight 185 lb (83.9 kg)     Height 5\' 5"  (1.651 m)     Head Circumference      Peak Flow      Pain Score 9     Pain Loc      Pain Edu?      Excl. in [place]?     Constitutional: Alert and oriented. Well appearing and in no acute distress. Eyes: Conjunctivae are normal.  ENT      Head: Normocephalic and atraumatic. Cardiovascular: Normal rate, regular rhythm. Grossly normal heart sounds.  Good peripheral circulation. Respiratory: Normal respiratory effort without tachypnea nor retractions. Breath sounds are clear and equal bilaterally. No wheezes, rales, rhonchi. Musculoskeletal: Steady gait.  Bilateral hand grip strong and equal.  Bilateral distal radial pulses equal.  No point midline cervical tenderness.  No midline thoracic or lumbar tenderness palpation.  Except: Point tenderness left mid trapezius with palpable muscle tension and palpable muscle spasm with right and left cervical rotation, guarded right and left cervical rotation, good cervical flexion and extension, shoulder nontender, no paresthesias noted to upper extremities. Neurologic:  Normal speech and language. Speech is normal. No gait instability.  Skin:  Skin is warm, dry and intact. No rash noted. Psychiatric: Mood and affect are normal. Speech and behavior are normal. Patient exhibits appropriate insight and judgment   ___________________________________________   LABS (all labs ordered are listed, but only abnormal results are displayed)  Labs Reviewed - No data to display   PROCEDURES Procedures    INITIAL IMPRESSION / ASSESSMENT AND PLAN / ED COURSE  Pertinent labs & imaging results that were available during my care of the patient were reviewed by me and considered in my medical decision making (see chart for details).  Well-appearing patient.  No acute  distress.  Suspect musculoskeletal trapezius strain.  No point bony tenderness, no trauma.  60 mg IM Toradol given once in urgent care.  Will treat with Mobic and parent Flexeril.  Encourage rest, fluids, supportive care, ice, heat and monitoring. Discussed indication, risks and benefits of medications with patient.  Discussed follow up and return parameters including no resolution or any worsening concerns. Patient verbalized understanding and agreed to plan.   ____________________________________________   FINAL CLINICAL IMPRESSION(S) / ED DIAGNOSES  Final diagnoses:  Trapezius strain, left, initial encounter     ED Discharge Orders         Ordered    cyclobenzaprine (FLEXERIL) 10 MG tablet  2 times daily PRN     12/14/19 1157    meloxicam (MOBIC) 15 MG tablet  Daily PRN     12/14/19 1157           Note: This dictation was prepared with Dragon dictation along with smaller phrase technology. Any transcriptional errors that  result from this process are unintentional.         Marylene Land, NP 12/14/19 1344

## 2019-12-19 ENCOUNTER — Other Ambulatory Visit: Payer: Self-pay

## 2019-12-19 DIAGNOSIS — N6452 Nipple discharge: Secondary | ICD-10-CM

## 2019-12-19 DIAGNOSIS — D242 Benign neoplasm of left breast: Secondary | ICD-10-CM

## 2020-01-19 DIAGNOSIS — E669 Obesity, unspecified: Secondary | ICD-10-CM | POA: Insufficient documentation

## 2020-01-19 DIAGNOSIS — K581 Irritable bowel syndrome with constipation: Secondary | ICD-10-CM | POA: Insufficient documentation

## 2020-01-27 ENCOUNTER — Ambulatory Visit
Admission: RE | Admit: 2020-01-27 | Discharge: 2020-01-27 | Disposition: A | Discharge status: Home / Self Care | Payer: 59 | Source: Ambulatory Visit | Attending: Surgery | Admitting: Surgery

## 2020-01-27 ENCOUNTER — Other Ambulatory Visit: Payer: Self-pay

## 2020-01-27 DIAGNOSIS — N6452 Nipple discharge: Secondary | ICD-10-CM | POA: Diagnosis present

## 2020-01-27 DIAGNOSIS — D242 Benign neoplasm of left breast: Secondary | ICD-10-CM | POA: Diagnosis not present

## 2020-01-27 IMAGING — MR MR BREAST BILAT WO/W CM
2 of 9 series · 6 of 48 positions shown · IV contrast (gadavist)
Comparison: Previous exam(s).

CLINICAL DATA: 33-year-old female for six-month follow-up of benign
MR guided LEFT breast biopsy. History of LEFT nipple discharge.

LABS:  None performed today
EXAM:
BILATERAL BREAST MRI WITH AND WITHOUT CONTRAST
TECHNIQUE: Multiplanar, multisequence MR images of both breasts were obtained
prior to and following the intravenous administration of 7.5 ml of
Gadavist

[Series 2: T1 · axial · B · 1.5mm · 1.05mm/px · z∈[-81,+98]mm · 5 of 120 slices shown]
[im 1/120]
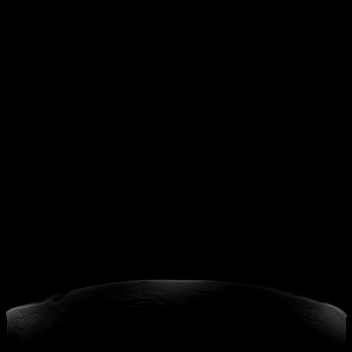
[im 30/120]
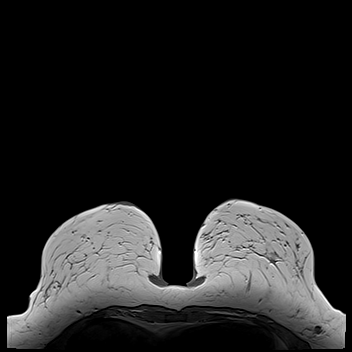
[im 60/120]
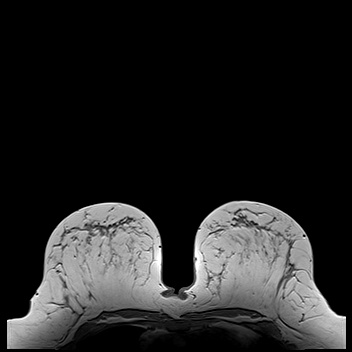
[im 90/120]
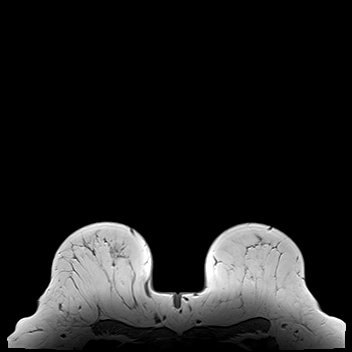
[im 120/120]
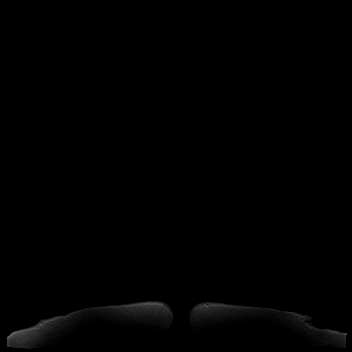

[Series 3: T2 · axial · B · 3.0mm · 1.05mm/px · 1 of 44 slices shown]
[im 1/44]
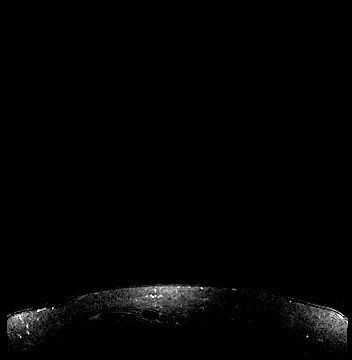

[6 of 48 positions shown; findings below may reference images not displayed]

Three-dimensional MR images were rendered by post-processing of the
original MR data on an independent workstation. The
three-dimensional MR images were interpreted, and findings are
reported in the following complete MRI report for this study. Three
dimensional images were evaluated at the independent [employer]
workstation
FINDINGS: Breast composition: c. Heterogeneous fibroglandular tissue.

Background parenchymal enhancement: Mild

Right breast: No mass or abnormal enhancement.

Left breast: Biopsy clip artifact within the anterior UPPER LEFT
breast is identified with minimal adjacent enhancement, slightly
decreased from the prior study. No new or suspicious LEFT breast
abnormalities are identified.

Lymph nodes: No abnormal appearing lymph nodes.

Ancillary findings:  None.
IMPRESSION: 1. No MR findings suspicious for breast malignancy. Biopsy clip
artifact and slightly decreased enhancement within the anterior
UPPER LEFT breast at the site of prior MR abnormality/biopsy. No
further imaging follow-up is recommended.

RECOMMENDATION:
Clinical/surgical follow-up as clinically indicated if LEFT nipple
discharge has persisted.

Bilateral screening mammogram at age 40 otherwise.

BI-RADS CATEGORY  2: Benign.

## 2020-01-27 MED ORDER — GADOBUTROL 1 MMOL/ML IV SOLN
7.5000 mL | Freq: Once | INTRAVENOUS | Status: AC | PRN
  Administered 2020-01-27: 7.5 mL via INTRAVENOUS

## 2020-01-30 ENCOUNTER — Telehealth: Payer: Self-pay

## 2020-01-30 NOTE — Telephone Encounter (Signed)
-----   Message from Jules Husbands, MD sent at 01/27/2020  5:26 PM EST ----- Please let her know her mammogram showed no concerning lesions. May f/u w me ----- Message ----- From: Interface, Rad Results In Sent: 01/27/2020  11:43 AM EST To: Jules Husbands, MD

## 2020-01-30 NOTE — Telephone Encounter (Signed)
Patient notified of mammogram results per Dr.Pabon and verbalizes understanding.

## 2020-02-01 ENCOUNTER — Ambulatory Visit: Payer: Self-pay | Admitting: Surgery

## 2020-02-15 ENCOUNTER — Ambulatory Visit: Payer: 59 | Admitting: Dermatology

## 2020-03-08 ENCOUNTER — Ambulatory Visit: Payer: 59 | Admitting: Dermatology

## 2020-04-12 ENCOUNTER — Encounter: Payer: Self-pay | Admitting: *Deleted

## 2020-08-15 ENCOUNTER — Telehealth: Payer: Self-pay

## 2020-08-15 NOTE — Telephone Encounter (Signed)
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 She states that the issue has not cleared up at all.

## 2020-08-15 NOTE — Telephone Encounter (Signed)
Please let her know I am happy to refer to [facility]. I do not believe we ultimately did the treatment with dapsone which was what we had planned at her last visit, but they can review notes and pathology results and see if they recommend treating with dapsone or taking a different approach. Please send referral to [facility]. Thank you!

## 2020-08-16 ENCOUNTER — Other Ambulatory Visit: Payer: Self-pay

## 2020-08-16 DIAGNOSIS — L88 Pyoderma gangrenosum: Secondary | ICD-10-CM

## 2020-08-16 NOTE — Progress Notes (Signed)
amb  

## 2020-08-21 ENCOUNTER — Telehealth: Payer: Self-pay

## 2020-08-21 NOTE — Telephone Encounter (Signed)
Patient's notes (from [employer]), labs, path, demographics and insurance information faxed to [facility] at [phone] for referral, JS

## 2020-09-11 ENCOUNTER — Telehealth: Payer: Self-pay

## 2020-09-11 NOTE — Telephone Encounter (Signed)
Called and spoke with patient concerning referral to [facility]. Patient advised earliest appt was in late December 2021, JS

## 2020-10-10 ENCOUNTER — Telehealth: Payer: Self-pay

## 2020-10-10 NOTE — Telephone Encounter (Signed)
Patient called the office regarding the COVID vaccine. Her employer has now mandated this as of yesterday and they have until next Friday to have the first shot completed. She is still waiting to be seen at [facility] (the appointment is 1 month away). She states she does not want to get the vaccine at this time until she can have a final diagnosis from [facility] regarding autoimmune issues she is currently having. She has asked if you would be willing to provide her with a letter until she is under [facility] care.

## 2020-10-10 NOTE — Telephone Encounter (Signed)
Please let Thy know that autoimmune conditions are actually not a contraindication to getting the COVID vaccine and so I cannot provide a medical exemption.  I would not expected to make the spot worse, however. Thank you

## 2020-10-11 ENCOUNTER — Telehealth: Payer: Self-pay | Admitting: Surgery

## 2020-10-11 NOTE — Telephone Encounter (Signed)
Patient calls wanting to know if she needs to have another breast MRI done.  Her last one was Feb. 2021.  She has been monitoring her breast closely.  Please call her if another MRI and/or follow up with Dr. Dahlia Byes needed.  Patient also states that her work is now requiring the Covid vaccine and she is concerned about what the vaccine may or can do with the lymph nodes and concerned also about that.  Is wondering if Dr. Dahlia Byes would sign off on form to exempt her from taking the vaccine?  Please call patient.  Thank you.

## 2020-10-11 NOTE — Telephone Encounter (Signed)
Scheduled patient to come in for a consultation with Dr Dahlia Byes. She is not having any breast discharge. He only concern is that she has lost a lot of weight and has noticed that he breasts seem more lumpy and would like to be checked before any imaging is ordered.

## 2020-10-11 NOTE — Telephone Encounter (Signed)
Patient advised of information per Dr. Moye. 

## 2020-10-11 NOTE — Telephone Encounter (Signed)
Left message for patient to return my call.

## 2020-10-11 NOTE — Telephone Encounter (Signed)
Pt may need to come again for an evaluation.

## 2020-10-22 ENCOUNTER — Ambulatory Visit: Payer: 59 | Admitting: Surgery

## 2020-10-31 ENCOUNTER — Other Ambulatory Visit: Payer: Self-pay

## 2020-10-31 ENCOUNTER — Encounter: Payer: Self-pay | Admitting: Surgery

## 2020-10-31 ENCOUNTER — Ambulatory Visit: Payer: BC Managed Care – PPO | Admitting: Surgery

## 2020-10-31 VITALS — BP 144/82 | HR 109 | Temp 98.1°F | Ht 65.0 in | Wt 147.8 lb

## 2020-10-31 DIAGNOSIS — N6452 Nipple discharge: Secondary | ICD-10-CM

## 2020-10-31 DIAGNOSIS — D242 Benign neoplasm of left breast: Secondary | ICD-10-CM | POA: Diagnosis not present

## 2020-10-31 NOTE — Patient Instructions (Addendum)
We will schedule you an appointment to have a ultrasound. If you have any concerns or questions, please give our office a call.    Breast Self-Awareness Breast self-awareness is knowing how your breasts look and feel. Doing breast self-awareness is important. It allows you to catch a breast problem early while it is still small and can be treated. All women should do breast self-awareness, including women who have had breast implants. Tell your doctor if you notice a change in your breasts. What you need:  A mirror.  A well-lit room. How to do a breast self-exam A breast self-exam is one way to learn what is normal for your breasts and to check for changes. To do a breast self-exam: Look for changes  1. Take off all the clothes above your waist. 2. Stand in front of a mirror in a room with good lighting. 3. Put your hands on your hips. 4. Push your hands down. 5. Look at your breasts and nipples in the mirror to see if one breast or nipple looks different from the other. Check to see if: ? The shape of one breast is different. ? The size of one breast is different. ? There are wrinkles, dips, and bumps in one breast and not the other. 6. Look at each breast for changes in the skin, such as: ? Redness. ? Scaly areas. 7. Look for changes in your nipples, such as: ? Liquid around the nipples. ? Bleeding. ? Dimpling. ? Redness. ? A change in where the nipples are. Feel for changes  1. Lie on your back on the floor. 2. Feel each breast. To do this, follow these steps: ? Pick a breast to feel. ? Put the arm closest to that breast above your head. ? Use your other arm to feel the nipple area of your breast. Feel the area with the pads of your three middle fingers by making small circles with your fingers. For the first circle, press lightly. For the second circle, press harder. For the third circle, press even harder. ? Keep making circles with your fingers at the different pressures  as you move down your breast. Stop when you feel your ribs. ? Move your fingers a little toward the center of your body. ? Start making circles with your fingers again, this time going up until you reach your collarbone. ? Keep making up-and-down circles until you reach your armpit. Remember to keep using the three pressures. ? Feel the other breast in the same way. 3. Sit or stand in the tub or shower. 4. With soapy water on your skin, feel each breast the same way you did in step 2 when you were lying on the floor. Write down what you find Writing down what you find can help you remember what to tell your doctor. Write down:  What is normal for each breast.  Any changes you find in each breast, including: ? The kind of changes you find. ? Whether you have pain. ? Size and location of any lumps.  When you last had your menstrual period. General tips  Check your breasts every month.  If you are breastfeeding, the best time to check your breasts is after you feed your baby or after you use a breast pump.  If you get menstrual periods, the best time to check your breasts is 5-7 days after your menstrual period is over.  With time, you will become comfortable with the self-exam, and you will begin to  know if there are changes in your breasts. Contact a doctor if you:  See a change in the shape or size of your breasts or nipples.  See a change in the skin of your breast or nipples, such as red or scaly skin.  Have fluid coming from your nipples that is not normal.  Find a lump or thick area that was not there before.  Have pain in your breasts.  Have any concerns about your breast health. Summary  Breast self-awareness includes looking for changes in your breasts, as well as feeling for changes within your breasts.  Breast self-awareness should be done in front of a mirror in a well-lit room.  You should check your breasts every month. If you get menstrual periods, the best  time to check your breasts is 5-7 days after your menstrual period is over.  Let your doctor know of any changes you see in your breasts, including changes in size, changes on the skin, pain or tenderness, or fluid from your nipples that is not normal. This information is not intended to replace advice given to you by your health care provider. Make sure you discuss any questions you have with your health care provider. Document Revised: 07/06/2018 Document Reviewed: 07/06/2018 Elsevier Patient Education  [place].

## 2020-11-01 NOTE — Progress Notes (Signed)
Outpatient Surgical Follow Up  11/01/2020  Melanie Choi is an 34 y.o. female.   Chief Complaint  Patient presents with  . Follow-up    left breast d/c and biopsy    HPI: Melanie Choi a 34 y.o.female well-known to me following for history of left nipple discharge and recent MRI guided biopsy.  Please note that personal review the MRI as well as the biopsy.  Changes consistent with fibrocystic disease.  She reports that her nipple discharge have subsided.   She continues to endorse some intermittent discomfort in her left breast with mild pain as well as some palpable nodularity.  She also states that she feels sometimes palpable nodularity on her right side.  No fevers no chills. She is also concerned about Covid vaccination and affecting imaging results regarding axillary findings and breast findings.   Past Medical History:  Diagnosis Date  . Anemia   . Arthritis   . Complication of anesthesia   . Dysmenorrhea   . Family history of adverse reaction to anesthesia    MOM-NAUSEA  . GERD (gastroesophageal reflux disease)    OCC  . History of methicillin resistant staphylococcus aureus (MRSA) 2005  . PONV (postoperative nausea and vomiting)    VERY NAUSEATED  . Seizures ([place])    AGE 56 X1    Past Surgical History:  Procedure Laterality Date  . ABDOMINAL HYSTERECTOMY    . ABLATION    . BREAST BIOPSY Left 07/27/2019  . BREAST SURGERY    . CESAREAN SECTION     X2  . COLONOSCOPY    . LAPAROSCOPIC ASSISTED VAGINAL HYSTERECTOMY N/A 02/15/2018   Procedure: LAPAROSCOPIC ASSISTED VAGINAL HYSTERECTOMY;  Surgeon: Harlin Heys, MD;  Location: [facility] ORS;  Service: Gynecology;  Laterality: N/A;  . LAPAROSCOPIC BILATERAL SALPINGECTOMY Bilateral 02/15/2018   Procedure: LAPAROSCOPIC BILATERAL SALPINGECTOMY;  Surgeon: Harlin Heys, MD;  Location: [facility] ORS;  Service: Gynecology;  Laterality: Bilateral;  . REDUCTION MAMMAPLASTY    . SHOULDER SURGERY Right   . TUBAL LIGATION     . UPPER GI ENDOSCOPY      Family History  Problem Relation Age of Onset  . Thyroid disease Mother   . Heart attack Father   . Hyperlipidemia Father   . Breast cancer Maternal Aunt     Social History:  reports that she has never smoked. She has never used smokeless tobacco. She reports current alcohol use. She reports that she does not use drugs.  Allergies:  Allergies  Allergen Reactions  . Morphine And Related Anaphylaxis  . Levonorgestrel-Ethinyl Estrad   . Other Rash    IF LEFT ON FOR LONG PERIODS OF TIME  . Percocet [Oxycodone-Acetaminophen] Rash  . Tape Rash    IF LEFT ON FOR LONG PERIODS OF TIME    Medications reviewed.    ROS Full ROS performed and is otherwise negative other than what is stated in HPI   BP (!) 144/82   Pulse (!) 109   Temp 98.1 F (36.7 C) (Oral)   Ht 5\' 5"  (1.651 m)   Wt 147 lb 12.8 oz (67 kg)   LMP 02/04/2018 (Approximate)   SpO2 99%   BMI 24.60 kg/m   Physical Exam Physical Exam Vitals signs and nursing note reviewed. Exam conducted with a chaperone present.  Constitutional:      General: She is not in acute distress.    Appearance: Normal appearance. She is normal weight.  Pulmonary:     Effort: Pulmonary effort is normal.  No respiratory distress.     Breath sounds: No stridor.     Comments: BREAST; evidence of prior reduction mammoplasty scars bilaterally. No discrete masses.  No nipple discharges.  No evidence of masses or discrete lesions on either breast.  No evidence of axillary lymphadenopathy. Skin:    General: Skin is warm and dry.     Capillary Refill: Capillary refill takes less than 2 seconds.     Coloration: Skin is not jaundiced.  Neurological:     General: No focal deficit present.     Mental Status: She is alert and oriented to person, place, and time.  Psychiatric:        Mood and Affect: Mood normal.        Behavior: Behavior normal.        Thought Content: Thought content normal.        Judgment:  Judgment normal.     Assessment/Plan: 34 year old female with history of fibrocystic breast and cystic lesion in the left breast as well as prior nipple discharge.  She continues to have some discomfort in the left side no discharge.  She feels some questionable lumps.  We will proceed with ultrasound of the left breast.  We will hold off on mammogram for now. Had a lengthy discussion with patient regarding Covid vaccine.  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  There is significant concerns on her part regarding potential lymphadenopathy and potential false positive related to axillary lymphadenopathy.  I do agree with her that in order to prevent any false positive results on imaging studies within her axilla and left breast it will be prudent to wait 6 months at least until we figured out her breast disease.  I have filled her form for the exception of Covid vaccine.  Extensive counseling provided   Greater than 50% of the 45 minutes  visit was spent in counseling/coordination of care   Caroleen Hamman, MD [facility]

## 2020-11-08 ENCOUNTER — Other Ambulatory Visit: Payer: Self-pay

## 2020-12-12 ENCOUNTER — Telehealth: Payer: Self-pay

## 2020-12-12 ENCOUNTER — Ambulatory Visit
Admission: RE | Admit: 2020-12-12 | Discharge: 2020-12-12 | Disposition: A | Discharge status: Home / Self Care | Payer: BC Managed Care – PPO | Source: Ambulatory Visit | Attending: Surgery | Admitting: Surgery

## 2020-12-12 ENCOUNTER — Other Ambulatory Visit: Payer: Self-pay

## 2020-12-12 DIAGNOSIS — N6452 Nipple discharge: Secondary | ICD-10-CM | POA: Insufficient documentation

## 2020-12-12 DIAGNOSIS — D242 Benign neoplasm of left breast: Secondary | ICD-10-CM | POA: Diagnosis not present

## 2020-12-12 IMAGING — MG DIGITAL DIAGNOSTIC BILAT W/ TOMO W/ CAD
6 of 10 series · 6 of 30 positions shown · non-contrast
Comparison: Prior films

CLINICAL DATA: Palpable lump left breast. Patient had prior benign
biopsy of left breast.

EXAM:
DIGITAL DIAGNOSTIC bilateral MAMMOGRAM WITH CAD AND TOMO
ULTRASOUND left BREAST

[L MLO synth-2D]
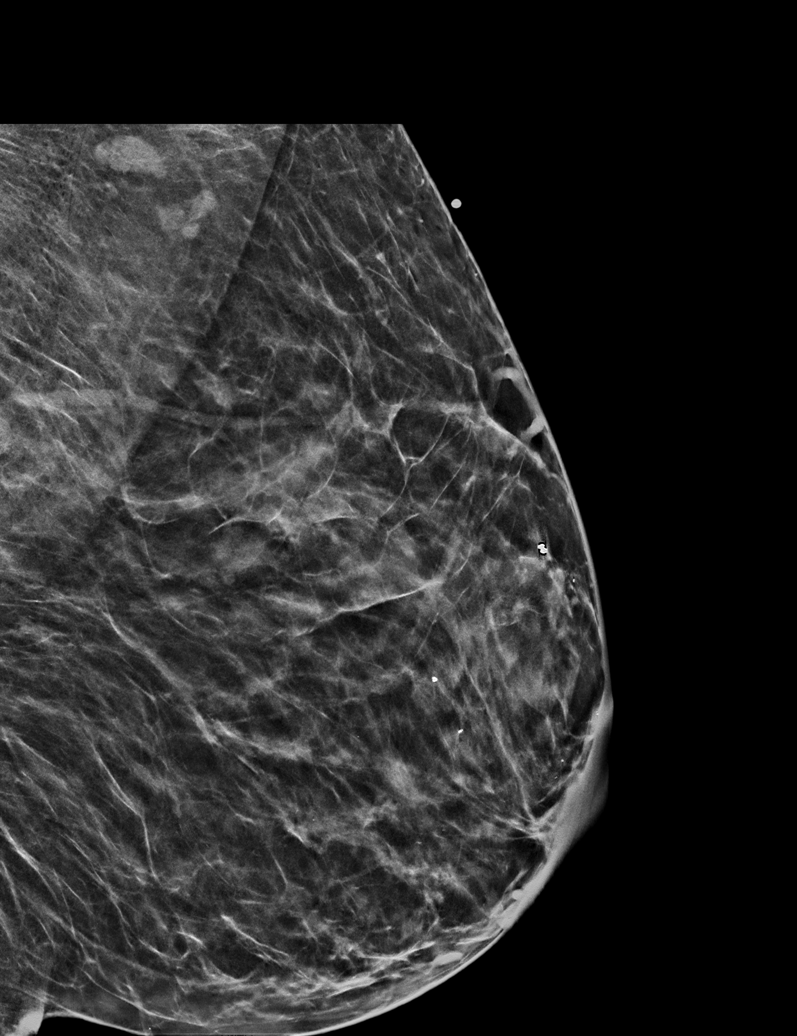

[R MLO synth-2D]
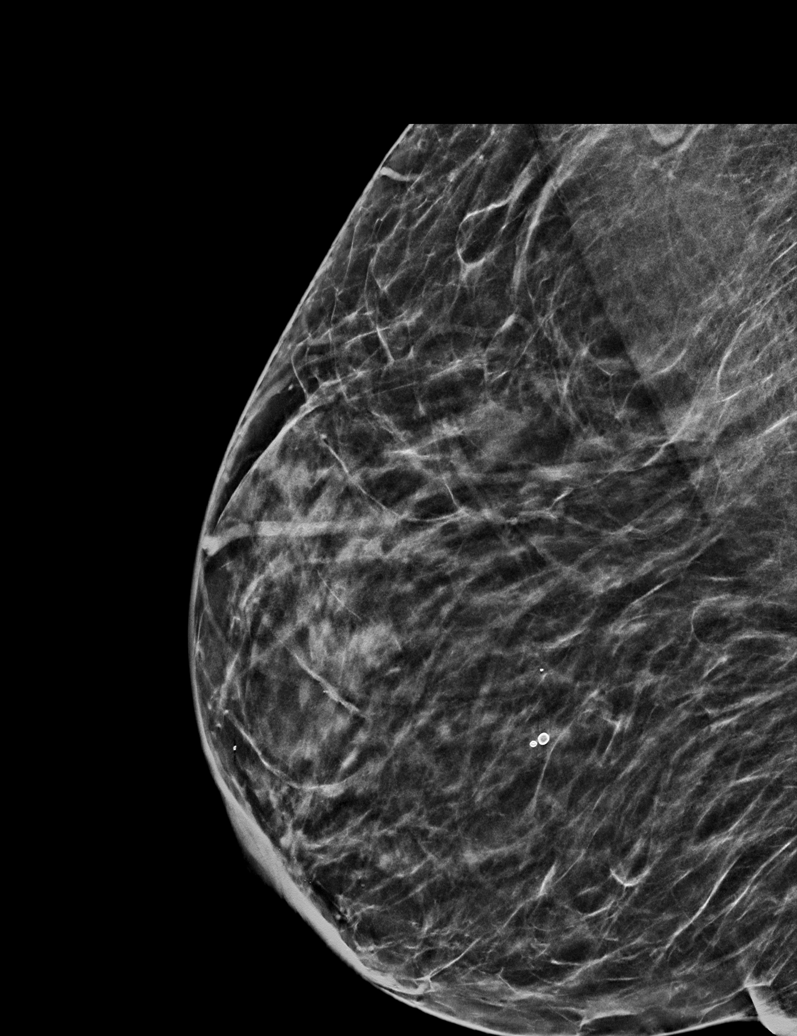

[L CC synth-2D]
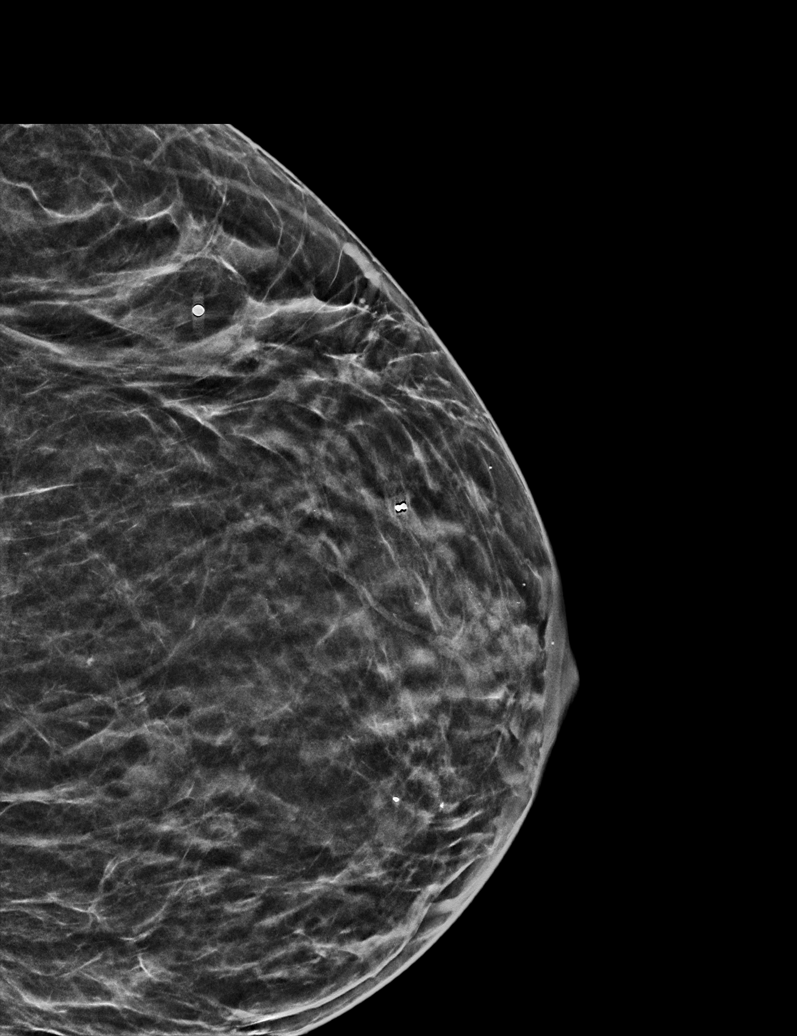

[L TAN synth-2D]
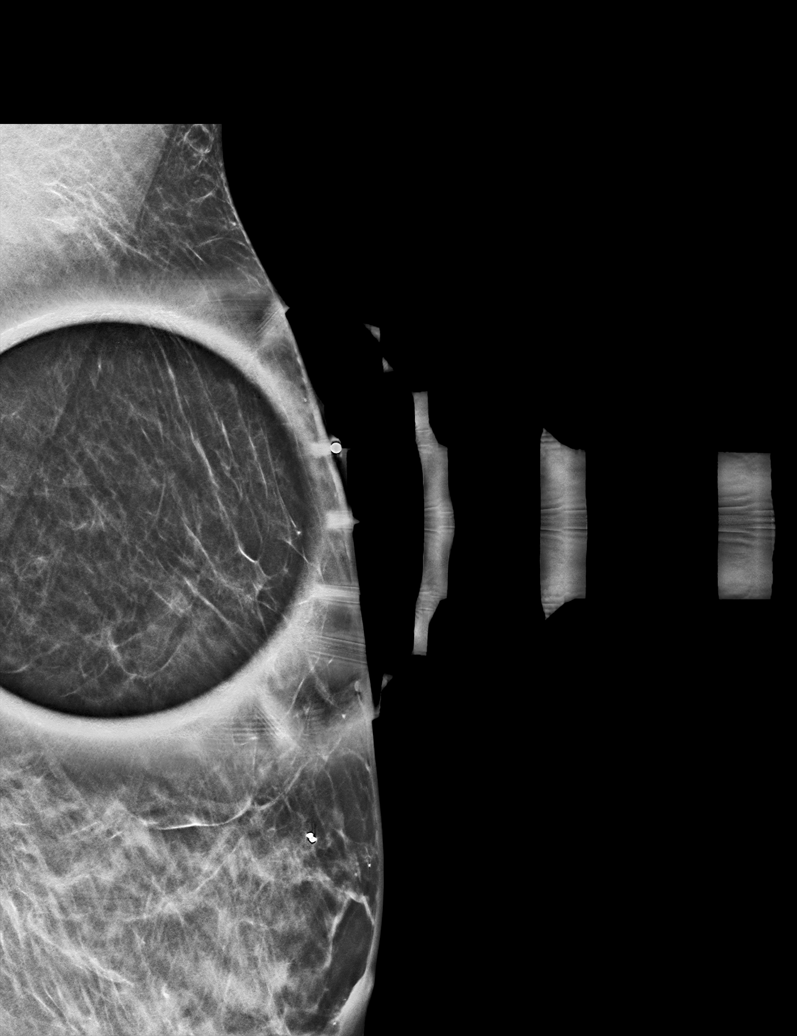

[R CC synth-2D]
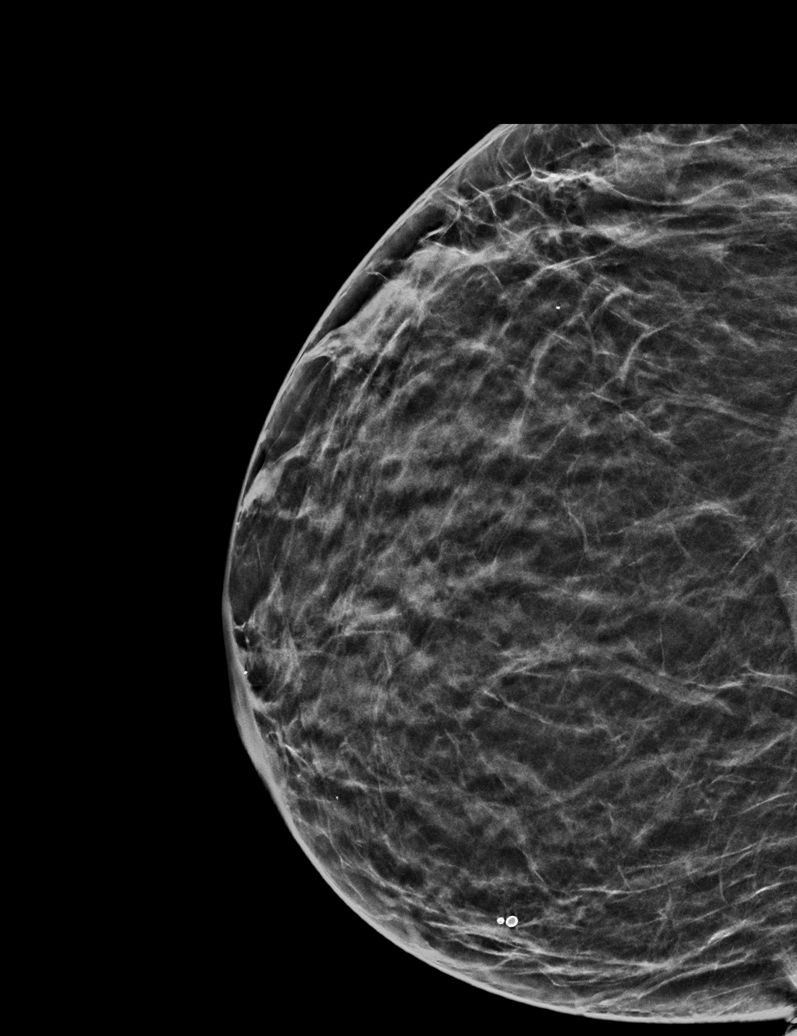

[R CC tomo · tomo slice 23/45.0]
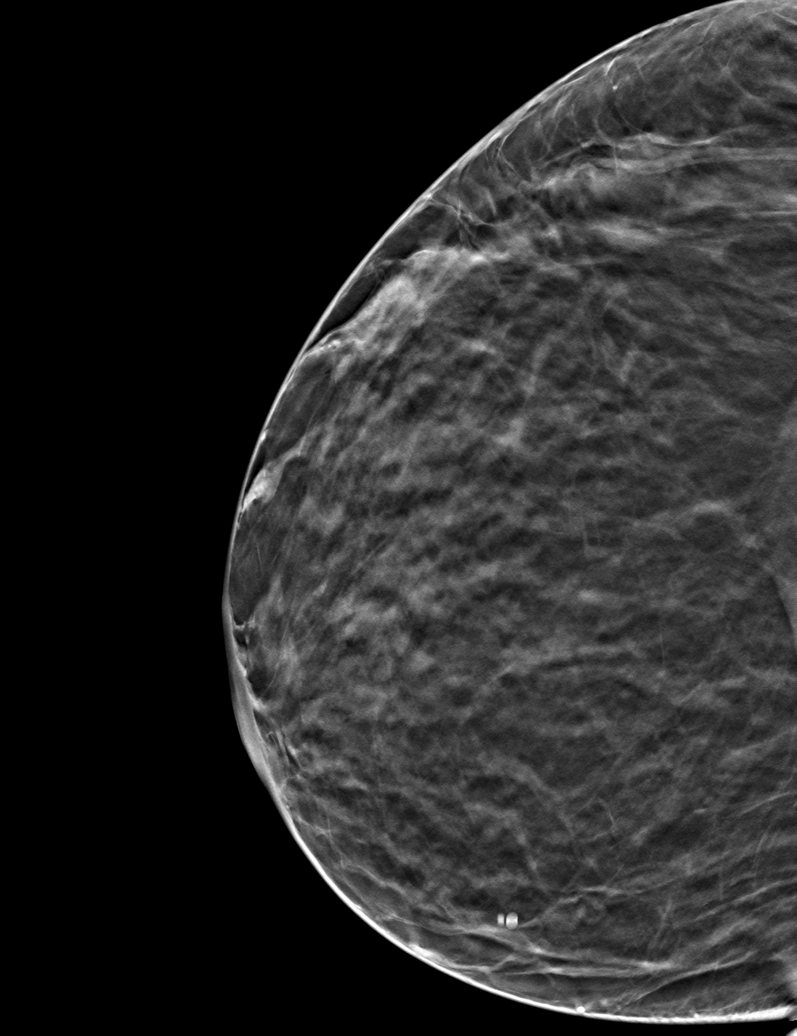

[6 of 30 positions shown; findings below may reference images not displayed]

ACR Breast Density Category b: There are scattered areas of
fibroglandular density.
FINDINGS: Cc and MLO views of bilateral breasts, spot compression left cc view
are submitted. Stable post biopsy changes are noted in the left
breast. No suspicious abnormalities identified bilaterally.

Mammographic images were processed with CAD.

Targeted ultrasound is performed, showing no focal abnormal discrete
cystic or solid lesion in the palpable area upper-outer quadrant
left breast.
IMPRESSION: Benign findings.

RECOMMENDATION:
Routine screening mammogram at age 40.

I have discussed the findings and recommendations with the patient.
If applicable, a reminder letter will be sent to the patient
regarding the next appointment.

BI-RADS CATEGORY  2: Benign.

## 2020-12-12 IMAGING — US US BREAST*L* LIMITED INC AXILLA
1 series · 4 of 4 positions shown · non-contrast
Comparison: Prior films

CLINICAL DATA: Palpable lump left breast. Patient had prior benign
biopsy of left breast.

EXAM:
DIGITAL DIAGNOSTIC bilateral MAMMOGRAM WITH CAD AND TOMO
ULTRASOUND left BREAST

[Series 1: us breast*left* limited inc axilla · 0.06mm/px · 4 of 4 slices shown]
[im 1/4]
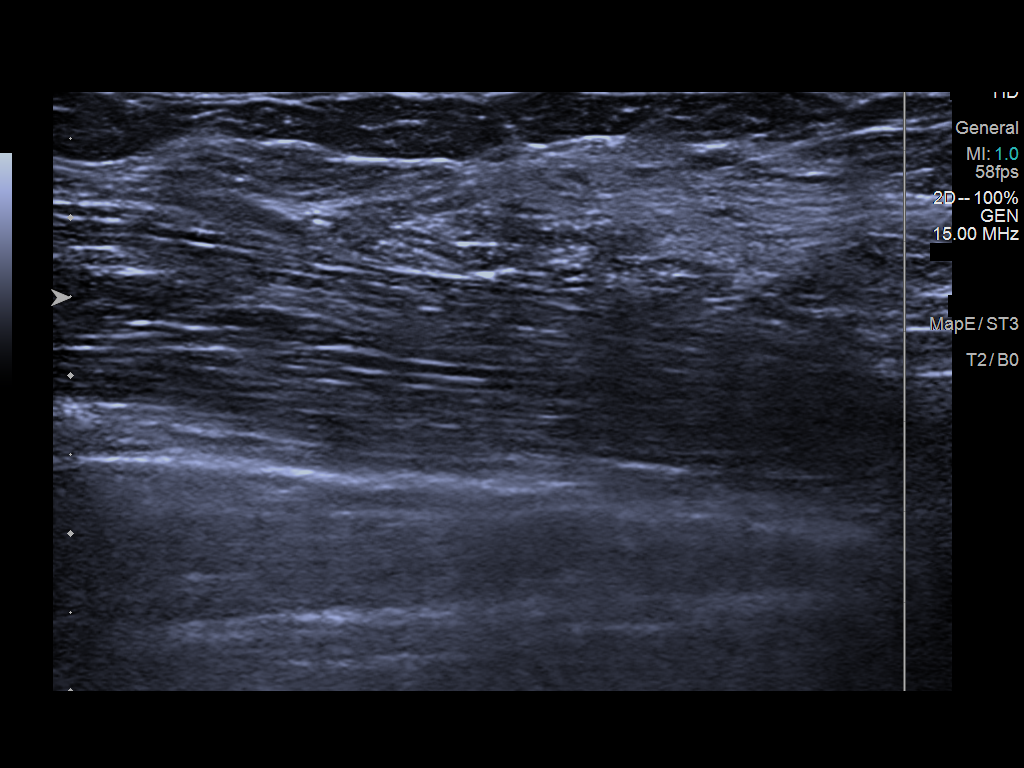
[im 2/4]
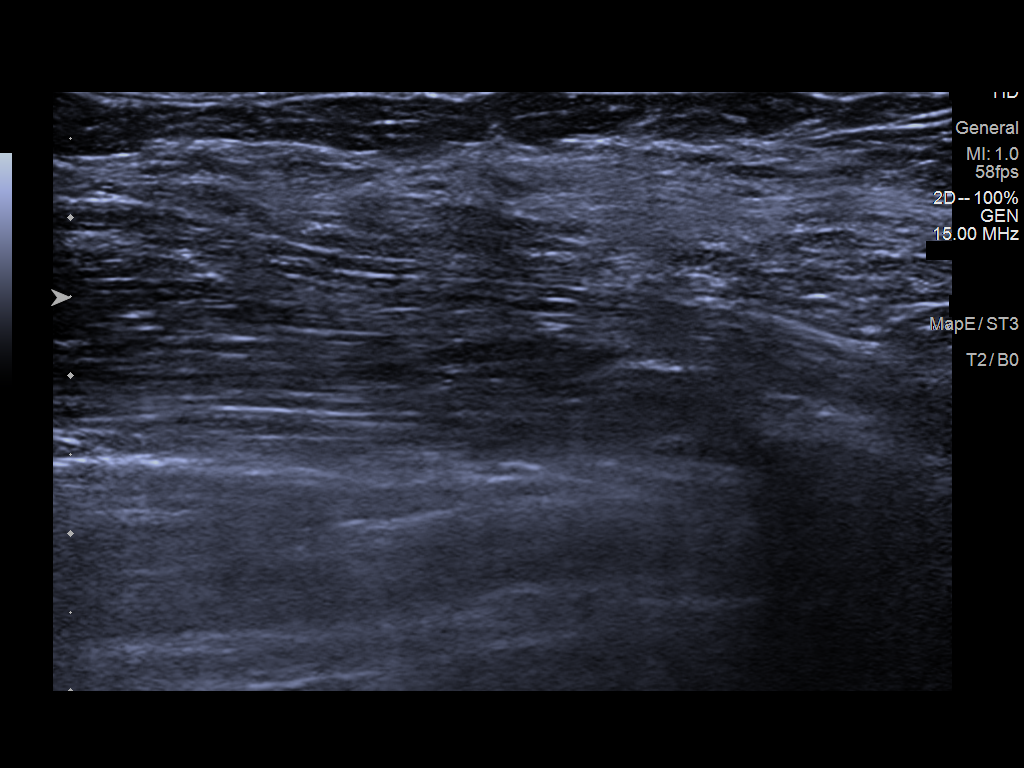
[im 3/4]
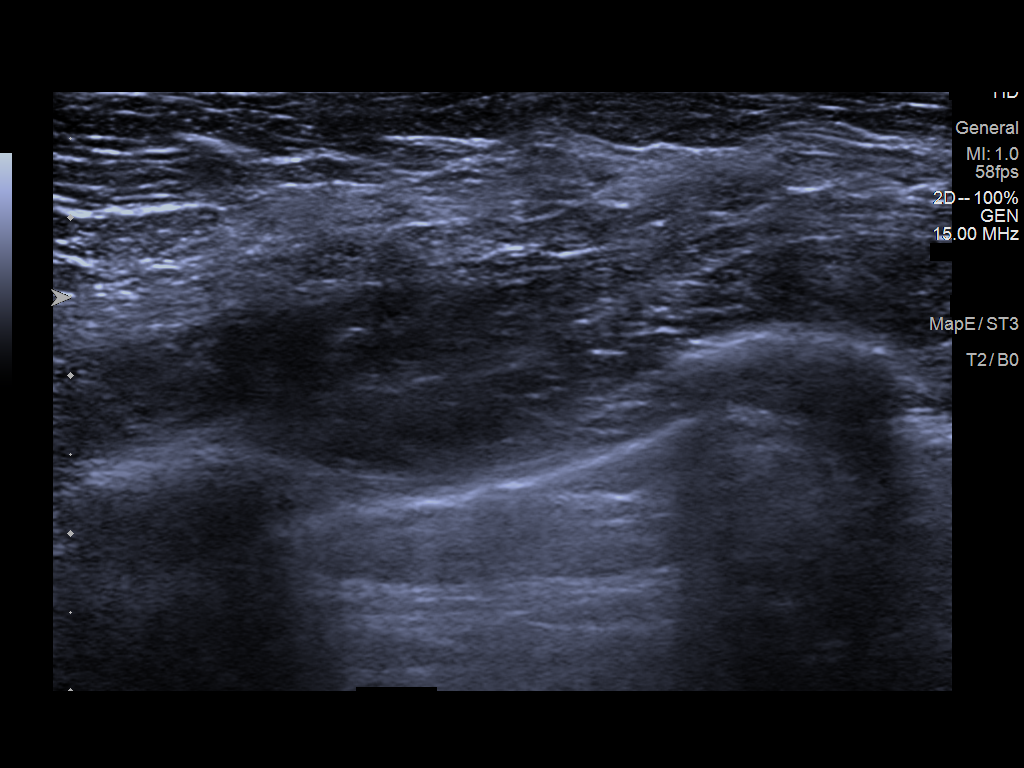
[im 4/4]
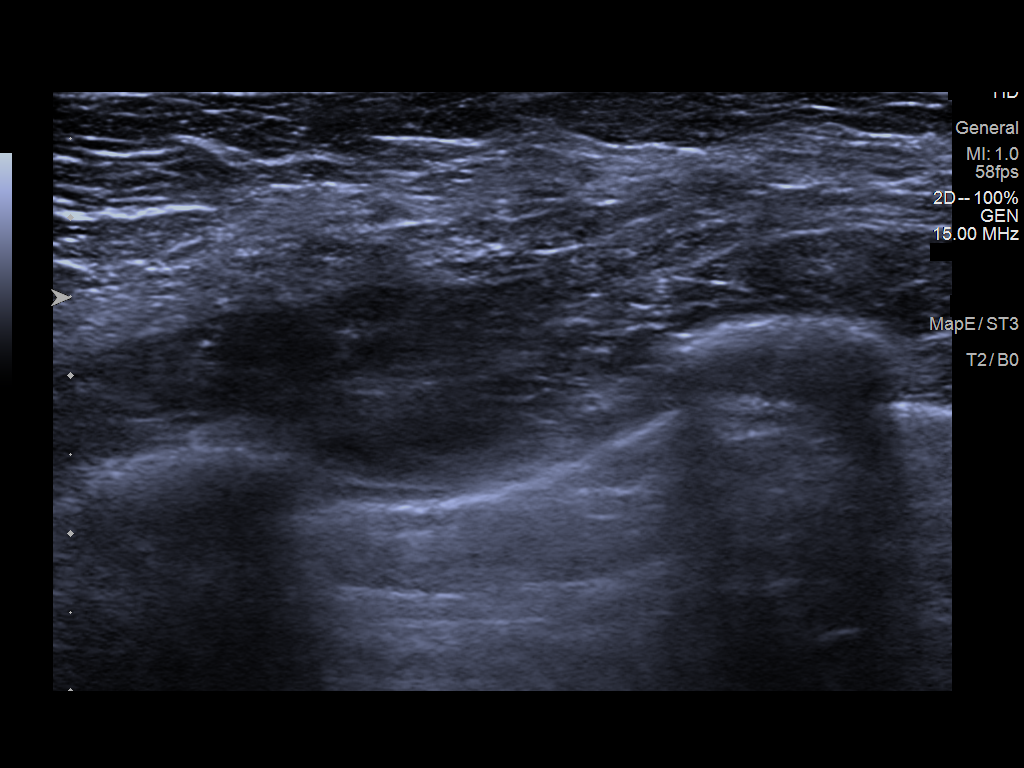

[4 of 4 positions shown; findings below may reference images not displayed]

ACR Breast Density Category b: There are scattered areas of
fibroglandular density.
FINDINGS: Cc and MLO views of bilateral breasts, spot compression left cc view
are submitted. Stable post biopsy changes are noted in the left
breast. No suspicious abnormalities identified bilaterally.

Mammographic images were processed with CAD.

Targeted ultrasound is performed, showing no focal abnormal discrete
cystic or solid lesion in the palpable area upper-outer quadrant
left breast.
IMPRESSION: Benign findings.

RECOMMENDATION:
Routine screening mammogram at age 40.

I have discussed the findings and recommendations with the patient.
If applicable, a reminder letter will be sent to the patient
regarding the next appointment.

BI-RADS CATEGORY  2: Benign.

## 2020-12-12 NOTE — Telephone Encounter (Signed)
Per Dr.Pabon-Mammogram normal keep scheduled appointment with Dr.Pabon.

## 2020-12-27 ENCOUNTER — Encounter: Payer: Self-pay | Admitting: Internal Medicine

## 2021-01-02 ENCOUNTER — Encounter: Payer: Self-pay | Admitting: Internal Medicine

## 2021-01-02 ENCOUNTER — Other Ambulatory Visit: Payer: Self-pay

## 2021-01-02 ENCOUNTER — Ambulatory Visit: Payer: BC Managed Care – PPO | Admitting: Internal Medicine

## 2021-01-02 VITALS — BP 118/72 | HR 86 | Temp 98.0°F | Ht 65.0 in | Wt 138.0 lb

## 2021-01-02 DIAGNOSIS — Z90710 Acquired absence of both cervix and uterus: Secondary | ICD-10-CM | POA: Insufficient documentation

## 2021-01-02 DIAGNOSIS — Z8711 Personal history of peptic ulcer disease: Secondary | ICD-10-CM

## 2021-01-02 DIAGNOSIS — N766 Ulceration of vulva: Secondary | ICD-10-CM | POA: Diagnosis not present

## 2021-01-02 DIAGNOSIS — J4520 Mild intermittent asthma, uncomplicated: Secondary | ICD-10-CM | POA: Diagnosis not present

## 2021-01-02 NOTE — Progress Notes (Signed)
Date:  01/02/2021   Name:  Melanie Choi   DOB:  Aug 01, 1986   MRN:  [id number]   Chief Complaint: Establish Care (Autoimmune referral )  Patient has a hx of persistent labial ulceration.  She has been seen by several GYN and Dermatologist.  The biopsy was inconclusive but negative for malignancy.  She has one ulcer that is fingertip sized and persistent with several smaller ulcers that come an go over time.  No eye or oral lesions. Bx done 06/2019 by [facility].  Several treatments have not helped such as estrogen cream.  She was seen in 08/2020 by Dr. Elinor Parkinson at [facility].  GC and chlamydia tests were negative.  He recommended a specific ID specialist at [facility] but she has not been able to schedule - the schedule is only 3 months out and is always full when she calls. 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  They apparently discussed treatment with Dapsone but she declined due to extensive pre and post testing and ongoing lab monitoring that were needed.  Now she realizes that it is not going to resolve on its own and need direction.  Asthma She complains of wheezing (intermittently). There is no cough or shortness of breath. This is a recurrent problem. The problem occurs every several days. The problem has been unchanged. Pertinent negatives include no chest pain, fever, headaches or heartburn. Her symptoms are alleviated by beta-agonist. She reports significant improvement on treatment. Her past medical history is significant for asthma.  Gastroesophageal Reflux She complains of wheezing (intermittently). She reports no chest pain, no coughing or no heartburn. and hx of gastric ulcer. The problem occurs rarely. Pertinent negatives include no fatigue. She has tried a PPI (not currently on medication) for the symptoms. Past procedures include an EGD.    No results found for: CREATININE, BUN, NA, K, CL, CO2 No results found for: CHOL, HDL,  LDLCALC, LDLDIRECT, TRIG, CHOLHDL Lab Results  Component Value Date   TSH 2.270 03/23/2019   No results found for: HGBA1C Lab Results  Component Value Date   WBC 7.1 02/10/2018   HGB 13.8 02/10/2018   HCT 41.0 02/10/2018   MCV 96.4 02/10/2018   PLT 238 02/10/2018   No results found for: ALT, AST, GGT, ALKPHOS, BILITOT   Review of Systems  Constitutional: Positive for unexpected weight change (50 lbs over past 8 months on phentermine). Negative for chills, fatigue and fever.  Respiratory: Positive for wheezing (intermittently). Negative for cough, chest tightness and shortness of breath.   Cardiovascular: Negative for chest pain, palpitations and leg swelling.  Gastrointestinal: Negative for heartburn.  Genitourinary: Positive for dyspareunia and genital sores.  Neurological: Negative for dizziness and headaches.  Psychiatric/Behavioral: Negative for dysphoric mood and sleep disturbance. The patient is not nervous/anxious.     Patient Active Problem List   Diagnosis Date Noted  . Irritable bowel syndrome with constipation 01/19/2020  . Obesity (BMI 30.0-34.9) 01/19/2020  . Pendulous breast 04/13/2018  . Post-operative state 02/15/2018  . Chronic midline low back pain without sciatica 01/12/2018  . Chronic midline thoracic back pain 01/12/2018  . GERD without esophagitis 01/12/2018  . High cholesterol 01/12/2018  . History of peptic ulcer 01/12/2018  . Separation of left acromioclavicular joint 08/30/2017    Allergies  Allergen Reactions  . Morphine And Related Anaphylaxis  . Levonorgestrel-Ethinyl Estrad   . Other Rash    IF LEFT ON FOR LONG PERIODS  OF TIME  . Percocet [Oxycodone-Acetaminophen] Rash  . Tape Rash    IF LEFT ON FOR LONG PERIODS OF TIME    Past Surgical History:  Procedure Laterality Date  . ABDOMINAL HYSTERECTOMY  2019  . ABLATION    . BREAST BIOPSY Left 07/27/2019  . CESAREAN SECTION     X2  . COLONOSCOPY    . LAPAROSCOPIC ASSISTED VAGINAL  HYSTERECTOMY N/A 02/15/2018   Procedure: LAPAROSCOPIC ASSISTED VAGINAL HYSTERECTOMY;  Surgeon: Linzie Collin, MD;  Location: [facility] ORS;  Service: Gynecology;  Laterality: N/A;  . LAPAROSCOPIC BILATERAL SALPINGECTOMY Bilateral 02/15/2018   Procedure: LAPAROSCOPIC BILATERAL SALPINGECTOMY;  Surgeon: Linzie Collin, MD;  Location: [facility] ORS;  Service: Gynecology;  Laterality: Bilateral;  . REDUCTION MAMMAPLASTY  2011  . SHOULDER SURGERY Right   . TUBAL LIGATION  2015  . UPPER GI ENDOSCOPY      Social History   Tobacco Use  . Smoking status: Never Smoker  . Smokeless tobacco: Never Used  Vaping Use  . Vaping Use: Never used  Substance Use Topics  . Alcohol use: Not Currently  . Drug use: No     Medication list has been reviewed and updated.  Current Meds  Medication Sig  . Multiple Vitamin (MULTI-VITAMIN) tablet Take by mouth.  . phentermine 15 MG capsule Take 15 mg by mouth every morning.    PHQ 2/9 Scores 01/02/2021  PHQ - 2 Score 1  PHQ- 9 Score 1    GAD 7 : Generalized Anxiety Score 01/02/2021  Nervous, Anxious, on Edge 0  Control/stop worrying 0  Worry too much - different things 0  Trouble relaxing 0  Restless 0  Easily annoyed or irritable 0  Afraid - awful might happen 0  Total GAD 7 Score 0    BP Readings from Last 3 Encounters:  01/02/21 118/72  10/31/20 (!) 144/82  12/14/19 122/70    Physical Exam Vitals and nursing note reviewed.  Constitutional:      General: She is not in acute distress.    Appearance: Normal appearance. She is well-developed.  HENT:     Head: Normocephalic and atraumatic.  Neck:     Vascular: No carotid bruit.  Cardiovascular:     Rate and Rhythm: Normal rate and regular rhythm.     Pulses: Normal pulses.     Heart sounds: No murmur heard.   Pulmonary:     Effort: Pulmonary effort is normal. No respiratory distress.     Breath sounds: No wheezing or rhonchi.  Genitourinary:    Comments: Exam deferred Musculoskeletal:      Cervical back: Normal range of motion.     Right lower leg: No edema.     Left lower leg: No edema.  Lymphadenopathy:     Cervical: No cervical adenopathy.  Skin:    General: Skin is warm and dry.     Findings: No rash.  Neurological:     General: No focal deficit present.     Mental Status: She is alert and oriented to person, place, and time.  Psychiatric:        Mood and Affect: Mood and affect and mood normal.     Wt Readings from Last 3 Encounters:  01/02/21 138 lb (62.6 kg)  10/31/20 147 lb 12.8 oz (67 kg)  12/14/19 185 lb (83.9 kg)    BP 118/72   Pulse (!) 116   Temp 98 F (36.7 C) (Oral)   Ht 5\' 5"  (1.651 m)  Wt 138 lb (62.6 kg)   LMP 02/04/2018 (Approximate)   SpO2 100%   BMI 22.96 kg/m   Assessment and Plan: 1. 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 Mild intermittent asthma without complication Continue Albuterol MDI PRN  3. History of peptic ulcer Not currently on PPI although recommended Last EGD  08/2019   Partially dictated using [place]. Any errors are unintentional.  Halina Maidens, MD [facility]  01/02/2021

## 2021-03-04 DIAGNOSIS — M352 Behcet's disease: Secondary | ICD-10-CM | POA: Insufficient documentation

## 2021-04-03 ENCOUNTER — Ambulatory Visit (INDEPENDENT_AMBULATORY_CARE_PROVIDER_SITE_OTHER): Payer: BC Managed Care – PPO | Admitting: Internal Medicine

## 2021-04-03 ENCOUNTER — Encounter: Payer: Self-pay | Admitting: Internal Medicine

## 2021-04-03 ENCOUNTER — Other Ambulatory Visit: Payer: Self-pay

## 2021-04-03 VITALS — BP 122/82 | HR 103 | Temp 97.7°F | Ht 65.0 in | Wt 130.0 lb

## 2021-04-03 DIAGNOSIS — N6019 Diffuse cystic mastopathy of unspecified breast: Secondary | ICD-10-CM

## 2021-04-03 DIAGNOSIS — Z0000 Encounter for general adult medical examination without abnormal findings: Secondary | ICD-10-CM | POA: Diagnosis not present

## 2021-04-03 DIAGNOSIS — Z1159 Encounter for screening for other viral diseases: Secondary | ICD-10-CM | POA: Diagnosis not present

## 2021-04-03 DIAGNOSIS — M352 Behcet's disease: Secondary | ICD-10-CM | POA: Diagnosis not present

## 2021-04-03 NOTE — Progress Notes (Signed)
Date:  04/03/2021   Name:  Melanie Choi   DOB:  [birth year]   MRN:  [id number]   Chief Complaint: Annual Exam (Breast exam no pap, exercises- walking X3-5 days a week)  Melanie Choi is a 35 y.o. female who presents today for her Complete Annual Exam. She feels fairly well. She reports exercising regularly. She reports she is sleeping fairly well. Breast complaints - none.  Mammogram: last done 10/2020 for galactorrhea/MRI done - s/p bx DEXA: not due Pap smear: discontinued Colonoscopy: not due  Immunization History  Administered Date(s) Administered  . Influenza,inj,Quad PF,6+ Mos 01/12/2018, 09/16/2018  . Influenza-Unspecified 09/16/2018  . Tdap 12/01/2013    HPI  No results found for: CREATININE, BUN, NA, K, CL, CO2 No results found for: CHOL, HDL, LDLCALC, LDLDIRECT, TRIG, CHOLHDL Lab Results  Component Value Date   TSH 2.270 03/23/2019   No results found for: HGBA1C Lab Results  Component Value Date   WBC 7.1 02/10/2018   HGB 13.8 02/10/2018   HCT 41.0 02/10/2018   MCV 96.4 02/10/2018   PLT 238 02/10/2018   No results found for: ALT, AST, GGT, ALKPHOS, BILITOT   Review of Systems  Constitutional: Positive for unexpected weight change (continues to lose weight with diet, exercise and phentermine.). Negative for chills, fatigue and fever.  HENT: Negative for congestion, hearing loss, tinnitus, trouble swallowing and voice change.   Eyes: Negative for visual disturbance.  Respiratory: Negative for cough, chest tightness, shortness of breath and wheezing.   Cardiovascular: Negative for chest pain, palpitations and leg swelling.  Gastrointestinal: Positive for constipation. Negative for abdominal pain, anal bleeding, diarrhea and vomiting.  Endocrine: Negative for polydipsia and polyuria.  Genitourinary: Negative for dysuria, frequency, genital sores, vaginal bleeding and vaginal discharge.  Musculoskeletal: Negative for arthralgias, gait problem and joint  swelling.  Skin: Negative for color change and rash.  Neurological: Positive for headaches (tension type). Negative for dizziness, tremors and light-headedness.  Hematological: Negative for adenopathy. Does not bruise/bleed easily.  Psychiatric/Behavioral: Negative for dysphoric mood and sleep disturbance. The patient is not nervous/anxious.     Patient Active Problem List   Diagnosis Date Noted  . 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 S/P hysterectomy 01/02/2021  . Genital labial ulcer 01/02/2021  . Mild intermittent asthma without complication 37/08/6268  . Irritable bowel syndrome with constipation 01/19/2020  . Pendulous breast 04/13/2018  . Chronic midline low back pain without sciatica 01/12/2018  . Chronic midline thoracic back pain 01/12/2018  . GERD without esophagitis 01/12/2018  . High cholesterol 01/12/2018  . History of peptic ulcer 01/12/2018  . Separation of left acromioclavicular joint 08/30/2017    Allergies  Allergen Reactions  . Morphine And Related Anaphylaxis  . Levonorgestrel-Ethinyl Estrad   . Other Rash    IF LEFT ON FOR LONG PERIODS OF TIME  . Percocet [Oxycodone-Acetaminophen] Rash  . Tape Rash    IF LEFT ON FOR LONG PERIODS OF TIME    Past Surgical History:  Procedure Laterality Date  . ABDOMINAL HYSTERECTOMY  2019  . ABLATION    . BREAST BIOPSY Left 07/27/2019  . CESAREAN SECTION     X2  . COLONOSCOPY  08/2019  . LAPAROSCOPIC ASSISTED VAGINAL HYSTERECTOMY N/A 02/15/2018   Procedure: LAPAROSCOPIC ASSISTED VAGINAL HYSTERECTOMY;  Surgeon: Harlin Heys, MD;  Location: [facility] ORS;  Service: Gynecology;  Laterality: N/A;  . LAPAROSCOPIC BILATERAL SALPINGECTOMY Bilateral 02/15/2018   Procedure: LAPAROSCOPIC BILATERAL SALPINGECTOMY;  Surgeon: Harlin Heys, MD;  Location: [facility]  ORS;  Service: Gynecology;  Laterality: Bilateral;  . REDUCTION MAMMAPLASTY  2011  . SHOULDER SURGERY Right   . TUBAL LIGATION  2015  . UPPER GI ENDOSCOPY       Social History   Tobacco Use  . Smoking status: Never Smoker  . Smokeless tobacco: Never Used  Vaping Use  . Vaping Use: Never used  Substance Use Topics  . Alcohol use: Not Currently  . Drug use: No     Medication list has been reviewed and updated.  Current Meds  Medication Sig  . BIOTIN PO Take by mouth daily.  . Multiple Vitamin (MULTI-VITAMIN) tablet Take by mouth.  . Omega-3 Fatty Acids (FISH OIL PO) Take by mouth daily.  . phentermine 15 MG capsule Take 15 mg by mouth every morning.    PHQ 2/9 Scores 04/03/2021 01/02/2021  PHQ - 2 Score 0 1  PHQ- 9 Score 0 1    GAD 7 : Generalized Anxiety Score 04/03/2021 01/02/2021  Nervous, Anxious, on Edge 0 0  Control/stop worrying 0 0  Worry too much - different things 0 0  Trouble relaxing 0 0  Restless 0 0  Easily annoyed or irritable 0 0  Afraid - awful might happen 0 0  Total GAD 7 Score 0 0    BP Readings from Last 3 Encounters:  04/03/21 122/82  01/02/21 118/72  10/31/20 (!) 144/82    Physical Exam Vitals and nursing note reviewed.  Constitutional:      General: She is not in acute distress.    Appearance: Normal appearance. She is well-developed.  HENT:     Head: Normocephalic and atraumatic.     Right Ear: Tympanic membrane and ear canal normal.     Left Ear: Tympanic membrane and ear canal normal.     Nose:     Right Sinus: No maxillary sinus tenderness.     Left Sinus: No maxillary sinus tenderness.  Eyes:     General: No scleral icterus.       Right eye: No discharge.        Left eye: No discharge.     Conjunctiva/sclera: Conjunctivae normal.  Neck:     Thyroid: No thyromegaly.     Vascular: No carotid bruit.  Cardiovascular:     Rate and Rhythm: Normal rate and regular rhythm.     Pulses: Normal pulses.     Heart sounds: Normal heart sounds.  Pulmonary:     Effort: Pulmonary effort is normal. No respiratory distress.     Breath sounds: No wheezing.  Chest:  Breasts:     Right: No mass,  nipple discharge, skin change or tenderness.     Left: No mass, nipple discharge, skin change or tenderness.      Comments: Reduction skin changes bilaterally Fibrocystic changes most noticeable in the right upper outer breast No nipple discharge Abdominal:     General: Abdomen is flat. Bowel sounds are normal.     Palpations: Abdomen is soft. There is no mass.     Tenderness: There is no abdominal tenderness. There is no guarding.     Hernia: No hernia is present.  Musculoskeletal:        General: Normal range of motion.     Cervical back: Normal range of motion. No erythema.     Right lower leg: No edema.     Left lower leg: No edema.  Lymphadenopathy:     Cervical: No cervical adenopathy.  Skin:  General: Skin is warm and dry.     Capillary Refill: Capillary refill takes less than 2 seconds.     Findings: No rash.  Neurological:     General: No focal deficit present.     Mental Status: She is alert and oriented to person, place, and time.     Cranial Nerves: No cranial nerve deficit.     Sensory: No sensory deficit.     Deep Tendon Reflexes: Reflexes are normal and symmetric.  Psychiatric:        Attention and Perception: Attention normal.        Mood and Affect: Mood normal.        Speech: Speech normal.     Wt Readings from Last 3 Encounters:  04/03/21 130 lb (59 kg)  01/02/21 138 lb (62.6 kg)  10/31/20 147 lb 12.8 oz (67 kg)    BP 122/82   Pulse (!) 103   Temp 97.7 F (36.5 C) (Oral)   Ht 5\' 5"  (1.651 m)   Wt 130 lb (59 kg)   LMP 02/04/2018 (Approximate)   SpO2 100%   BMI 21.63 kg/m   Assessment and Plan: 1. Annual physical exam Normal exam Continue healthy diet, exercise and weight loss with a goal of around 125 lb. - Lipid panel - TSH - Hemoglobin A1c  2. Need for hepatitis C screening test - Hepatitis C antibody  3. 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 Fibrocystic breast  changes, unspecified laterality Recommend that she establish with a breast specialist - will locate one at [facility] or [facility] and then refer.   Partially dictated using [place], commissioning. Any errors are unintentional.  Halina Maidens, MD [facility]  04/03/2021

## 2021-04-04 ENCOUNTER — Other Ambulatory Visit: Payer: Self-pay | Admitting: Internal Medicine

## 2021-04-04 DIAGNOSIS — N6019 Diffuse cystic mastopathy of unspecified breast: Secondary | ICD-10-CM

## 2021-04-04 LAB — TSH: TSH: 1.86 u[IU]/mL (ref 0.450–4.500)

## 2021-04-04 LAB — LIPID PANEL
Chol/HDL Ratio: 3.7 ratio (ref 0.0–4.4)
Cholesterol, Total: 236 mg/dL — ABNORMAL HIGH (ref 100–199)
HDL: 64 mg/dL (ref >39)
LDL Chol Calc (NIH): 153 mg/dL — ABNORMAL HIGH (ref 0–99)
Triglycerides: 109 mg/dL (ref 0–149)
VLDL Cholesterol Cal: 19 mg/dL (ref 5–40)

## 2021-04-04 LAB — HEMOGLOBIN A1C
Est. average glucose Bld gHb Est-mCnc: 100 mg/dL
Hgb A1c MFr Bld: 5.1 % (ref 4.8–5.6)

## 2021-04-04 LAB — HEPATITIS C ANTIBODY: Hep C Virus Ab: <0.1 s/co ratio (ref 0.0–0.9)

## 2021-05-14 DIAGNOSIS — R739 Hyperglycemia, unspecified: Secondary | ICD-10-CM | POA: Insufficient documentation

## 2021-05-17 DIAGNOSIS — M545 Low back pain, unspecified: Secondary | ICD-10-CM | POA: Diagnosis not present

## 2021-05-17 DIAGNOSIS — G8929 Other chronic pain: Secondary | ICD-10-CM | POA: Diagnosis not present

## 2021-07-03 ENCOUNTER — Telehealth: Payer: BC Managed Care – PPO | Admitting: *Deleted

## 2021-07-03 DIAGNOSIS — N62 Hypertrophy of breast: Secondary | ICD-10-CM

## 2021-07-03 NOTE — Telephone Encounter (Signed)
Patient called and stated that she would like for [place] to send a referral to [facility] for breast reduction.

## 2021-07-03 NOTE — Telephone Encounter (Signed)
Referral  placed in epic to plastics per request of patient for breast reduction. Patient notified -Someone will call patient to schedule an appointment.

## 2021-07-10 ENCOUNTER — Telehealth: Payer: Self-pay

## 2021-07-10 NOTE — Telephone Encounter (Signed)
Spoke with patient-she has not been contacted from [employer] office as of yet-I called Dr.Dillenhams office to check status and was told they would be working on referrals today and would be reaching out-I also gave patient address and phone number to contact her office if she doesn't hear from them soon.

## 2021-10-15 ENCOUNTER — Other Ambulatory Visit: Payer: Self-pay

## 2021-10-15 ENCOUNTER — Ambulatory Visit: Payer: BC Managed Care – PPO | Admitting: Plastic Surgery

## 2021-10-15 ENCOUNTER — Encounter: Payer: Self-pay | Admitting: Plastic Surgery

## 2021-10-15 VITALS — BP 118/80 | HR 78 | Ht 65.0 in | Wt 131.2 lb

## 2021-10-15 DIAGNOSIS — G8929 Other chronic pain: Secondary | ICD-10-CM

## 2021-10-15 DIAGNOSIS — M546 Pain in thoracic spine: Secondary | ICD-10-CM | POA: Diagnosis not present

## 2021-10-15 DIAGNOSIS — M352 Behcet's disease: Secondary | ICD-10-CM | POA: Diagnosis not present

## 2021-10-15 DIAGNOSIS — M549 Dorsalgia, unspecified: Secondary | ICD-10-CM | POA: Insufficient documentation

## 2021-10-15 DIAGNOSIS — N62 Hypertrophy of breast: Secondary | ICD-10-CM | POA: Diagnosis not present

## 2021-10-15 DIAGNOSIS — J4520 Mild intermittent asthma, uncomplicated: Secondary | ICD-10-CM

## 2021-10-15 NOTE — Progress Notes (Signed)
Patient ID: Melanie Choi, female    DOB: 09/16/1986, 35 y.o.   MRN: [id number]   Chief Complaint  Patient presents with   Advice Only    Mammary Hyperplasia: The patient is a 35 y.o. female with a history of mammary hyperplasia for several years.  She has extremely large breasts causing symptoms that include the following: Back pain in the upper and lower back, including neck pain. She pulls or pins her bra straps to provide better lift and relief of the pressure and pain. She notices relief by holding her breast up manually.  Her shoulder straps cause grooves and pain and pressure that requires padding for relief. Pain medication is sometimes required with motrin and tylenol.  Activities that are hindered by enlarged breasts include: exercise and running.  She has tried supportive clothing as well as fitted bras without improvement.  Her breasts are extremely large and fairly symmetric.  She has hyperpigmentation of the inframammary area on both sides.  The sternal to nipple distance on the right is 26 cm and the left is 26 cm.  The IMF distance is 10 cm.  She is 5 feet 5 inches tall and weighs 131 pounds.  The BMI = 21.8 kg/m.  Preoperative bra size = 32DDD cup. She would like to be a D/DD.  The estimated excess breast tissue to be removed at the time of surgery = 100 grams on the left and 100 grams on the right.  Mammogram history: 2021 and was negative.  Family history of breast cancer:  great maternal aunt.  Tobacco use:  none.   The patient had a breast reduction in 2011 in the [place] ([place]).  She went from a 36 DDD to a 36C.  She states that the breasts grew back quickly thereafter.  The patient expresses the desire to pursue surgical intervention.  She has had 2 C-sections and has a 19-year-old and 35-year-old child and she has Behcet's disease.  She did not have trouble healing after her surgery for her breasts in 2011.   Review of Systems  Constitutional:  Positive for activity  change. Negative for appetite change.  Eyes: Negative.   Respiratory: Negative.  Negative for chest tightness and shortness of breath.   Cardiovascular: Negative.  Negative for leg swelling.  Gastrointestinal: Negative.   Endocrine: Negative.   Genitourinary: Negative.   Musculoskeletal:  Positive for back pain and neck pain.  Skin: Negative.   Neurological: Negative.   Hematological: Negative.   Psychiatric/Behavioral: Negative.     Past Medical History:  Diagnosis Date   Anemia    Arthritis    Complication of anesthesia    Dysmenorrhea    Family history of adverse reaction to anesthesia    MOM-NAUSEA   GERD (gastroesophageal reflux disease)    OCC   History of methicillin resistant staphylococcus aureus (MRSA) 2005   PONV (postoperative nausea and vomiting)    VERY NAUSEATED   Seizures ([place])    AGE 68 X1    Past Surgical History:  Procedure Laterality Date   ABDOMINAL HYSTERECTOMY  2019   ABLATION     BREAST BIOPSY Left 07/27/2019   CESAREAN SECTION     X2   COLONOSCOPY  08/2019   LAPAROSCOPIC ASSISTED VAGINAL HYSTERECTOMY N/A 02/15/2018   Procedure: LAPAROSCOPIC ASSISTED VAGINAL HYSTERECTOMY;  Surgeon: Harlin Heys, MD;  Location: [facility] ORS;  Service: Gynecology;  Laterality: N/A;   LAPAROSCOPIC BILATERAL SALPINGECTOMY Bilateral 02/15/2018   Procedure: LAPAROSCOPIC BILATERAL SALPINGECTOMY;  Surgeon: Harlin Heys, MD;  Location: [facility] ORS;  Service: Gynecology;  Laterality: Bilateral;   REDUCTION MAMMAPLASTY  2011   SHOULDER SURGERY Right    TUBAL LIGATION  2015   UPPER GI ENDOSCOPY        Current Outpatient Medications:    BIOTIN PO, Take by mouth daily., Disp: , Rfl:    Multiple Vitamin (MULTI-VITAMIN) tablet, Take by mouth., Disp: , Rfl:    Omega-3 Fatty Acids (FISH OIL PO), Take by mouth daily., Disp: , Rfl:    phentermine 15 MG capsule, Take 15 mg by mouth every morning., Disp: , Rfl:    Objective:   Vitals:   10/15/21 0911  BP: 118/80  Pulse:  78  SpO2: 100%    Physical Exam Vitals and nursing note reviewed.  Constitutional:      Appearance: Normal appearance.  HENT:     Head: Normocephalic and atraumatic.  Cardiovascular:     Rate and Rhythm: Normal rate.     Pulses: Normal pulses.  Pulmonary:     Effort: Pulmonary effort is normal.  Abdominal:     General: There is no distension.     Palpations: Abdomen is soft.  Musculoskeletal:        General: No swelling or deformity.  Skin:    General: Skin is warm.     Capillary Refill: Capillary refill takes less than 2 seconds.     Coloration: Skin is not jaundiced.     Findings: No bruising.  Neurological:     Mental Status: She is alert and oriented to person, place, and time.  Psychiatric:        Mood and Affect: Mood normal.        Behavior: Behavior normal.        Thought Content: Thought content normal.    Assessment & Plan:  Mild intermittent asthma without complication  Behcet's disease ([facility])  Symptomatic mammary hypertrophy  Chronic bilateral thoracic back pain  The patient is better suited for a mastopexy.  She really does not have enough of volume to remove more than probably 100 g of tissue from each breast.  I am afraid she would be left with very low volume.  She does have skin laxity and this is not going to change.  Also her upper pole deficit will change much particularly in time.  The patient seems to understand that.  She is interested in a quote for a abdominoplasty and a mastopexy.  [place], DO

## 2022-01-30 DIAGNOSIS — M545 Low back pain, unspecified: Secondary | ICD-10-CM | POA: Diagnosis not present

## 2022-01-30 DIAGNOSIS — E785 Hyperlipidemia, unspecified: Secondary | ICD-10-CM | POA: Diagnosis not present

## 2022-01-30 DIAGNOSIS — N62 Hypertrophy of breast: Secondary | ICD-10-CM | POA: Diagnosis not present

## 2022-01-30 DIAGNOSIS — G8929 Other chronic pain: Secondary | ICD-10-CM | POA: Diagnosis not present

## 2022-02-02 DIAGNOSIS — S8391XA Sprain of unspecified site of right knee, initial encounter: Secondary | ICD-10-CM | POA: Diagnosis not present

## 2022-04-04 ENCOUNTER — Encounter: Payer: Self-pay | Admitting: Internal Medicine

## 2022-04-08 ENCOUNTER — Encounter: Payer: Self-pay | Admitting: Internal Medicine

## 2022-04-08 ENCOUNTER — Ambulatory Visit (INDEPENDENT_AMBULATORY_CARE_PROVIDER_SITE_OTHER): Payer: 59 | Admitting: Internal Medicine

## 2022-04-08 VITALS — BP 122/76 | HR 83 | Ht 65.0 in | Wt 129.0 lb

## 2022-04-08 DIAGNOSIS — J4520 Mild intermittent asthma, uncomplicated: Secondary | ICD-10-CM

## 2022-04-08 DIAGNOSIS — Z0000 Encounter for general adult medical examination without abnormal findings: Secondary | ICD-10-CM | POA: Diagnosis not present

## 2022-04-08 DIAGNOSIS — R1909 Other intra-abdominal and pelvic swelling, mass and lump: Secondary | ICD-10-CM | POA: Diagnosis not present

## 2022-04-08 DIAGNOSIS — M352 Behcet's disease: Secondary | ICD-10-CM

## 2022-04-08 NOTE — Progress Notes (Signed)
Date:  04/08/2022   Name:  Melanie Choi   DOB:  06/25/1986   MRN:  [id number]   Chief Complaint: Annual Exam Melanie Choi is a 36 y.o. female who presents today for her Complete Annual Exam. She feels well. She reports exercising - walking. She reports she is sleeping fairly well. Breast complaints - Has fibrocystic breast.  Mammogram: none DEXA: none Pap smear: discontinued Colonoscopy: none  SDOH Interventions    Flowsheet Row Most Recent Value  SDOH Interventions   Food Insecurity Interventions Intervention Not Indicated  Financial Strain Interventions Intervention Not Indicated  Housing Interventions Intervention Not Indicated  Intimate Partner Violence Interventions Intervention Not Indicated  Transportation Interventions Intervention Not Indicated  Depression Interventions/Treatment  PHQ2-9 Score <4 Follow-up Not Indicated        Health Maintenance Due  Topic Date Due   COVID-19 Vaccine (1) Never done   HIV Screening  Never done    Immunization History  Administered Date(s) Administered   Influenza,inj,Quad PF,6+ Mos 01/12/2018, 09/16/2018   Influenza-Unspecified 09/16/2018   Tdap 12/01/2013    HPI  No results found for: NA, K, CO2, GLUCOSE, BUN, CREATININE, CALCIUM, EGFR, GFRNONAA, GLUCOSE Lab Results  Component Value Date   CHOL 236 (H) 04/03/2021   HDL 64 04/03/2021   LDLCALC 153 (H) 04/03/2021   TRIG 109 04/03/2021   CHOLHDL 3.7 04/03/2021   Lab Results  Component Value Date   TSH 1.860 04/03/2021   Lab Results  Component Value Date   HGBA1C 5.1 04/03/2021   Lab Results  Component Value Date   WBC 7.1 02/10/2018   HGB 13.8 02/10/2018   HCT 41.0 02/10/2018   MCV 96.4 02/10/2018   PLT 238 02/10/2018   No results found for: ALT, AST, GGT, ALKPHOS, BILITOT No results found for: 25OHVITD2, 25OHVITD3, VD25OH   Review of Systems  Constitutional:  Negative for chills, fatigue and fever.  HENT:  Negative for congestion, hearing loss,  mouth sores, tinnitus, trouble swallowing and voice change.   Eyes:  Negative for visual disturbance.  Respiratory:  Negative for cough, chest tightness, shortness of breath and wheezing.   Cardiovascular:  Negative for chest pain, palpitations and leg swelling.  Gastrointestinal:  Negative for abdominal pain, constipation, diarrhea and vomiting.  Endocrine: Negative for polydipsia and polyuria.  Genitourinary:  Positive for genital sores (one ulcer from Bechet's on labia). Negative for dysuria, frequency, vaginal bleeding and vaginal discharge.  Musculoskeletal:  Negative for arthralgias, gait problem and joint swelling.  Skin:  Negative for color change and rash.  Neurological:  Negative for dizziness, tremors, light-headedness and headaches.  Hematological:  Positive for adenopathy (nodule in right groin). Does not bruise/bleed easily.  Psychiatric/Behavioral:  Negative for dysphoric mood and sleep disturbance. The patient is not nervous/anxious.    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   S/P total hysterectomy 01/02/2021   Genital labial ulcer 01/02/2021   Mild intermittent asthma without complication 01/02/2021   Irritable bowel syndrome with constipation 01/19/2020   Chronic midline low back pain without sciatica 01/12/2018   Chronic midline thoracic back pain 01/12/2018   GERD without esophagitis 01/12/2018   Mixed hyperlipidemia 01/12/2018   History of peptic ulcer 01/12/2018   Separation of left acromioclavicular joint 08/30/2017    Allergies  Allergen Reactions   Morphine And Related Anaphylaxis   Levonorgestrel-Ethinyl Charlott Holler  Other Rash    IF LEFT ON FOR LONG PERIODS OF TIME   Percocet [Oxycodone-Acetaminophen] Rash   Tape Rash    IF LEFT ON FOR LONG PERIODS OF TIME    Past Surgical History:  Procedure  Laterality Date   ABLATION     BREAST BIOPSY Left 07/27/2019   CESAREAN SECTION     X2   COLONOSCOPY  08/2019   LAPAROSCOPIC ASSISTED VAGINAL HYSTERECTOMY N/A 02/15/2018   Procedure: LAPAROSCOPIC ASSISTED VAGINAL HYSTERECTOMY;  Surgeon: Linzie Collin, MD;  Location: [facility];  Service: Gynecology;  Laterality: N/A;   LAPAROSCOPIC BILATERAL SALPINGECTOMY Bilateral 02/15/2018   Procedure: LAPAROSCOPIC BILATERAL SALPINGECTOMY;  Surgeon: Linzie Collin, MD;  Location: [facility];  Service: Gynecology;  Laterality: Bilateral;   LAPAROSCOPIC TOTAL HYSTERECTOMY  2019   REDUCTION MAMMAPLASTY  2011   SHOULDER SURGERY Right    TUBAL LIGATION  2015   UPPER GI ENDOSCOPY      Social History   Tobacco Use   Smoking status: Never   Smokeless tobacco: Never  Vaping Use   Vaping Use: Never used  Substance Use Topics   Alcohol use: Not Currently   Drug use: No     Medication list has been reviewed and updated.  Current Meds  Medication Sig   BIOTIN PO Take by mouth daily.   meloxicam (MOBIC) 15 MG tablet Take 15 mg by mouth 3 (three) times daily.   Multiple Vitamin (MULTI-VITAMIN) tablet Take by mouth.   Omega-3 Fatty Acids (FISH OIL PO) Take by mouth daily.   phentermine 15 MG capsule Take 15 mg by mouth every morning.       04/08/2022    9:24 AM 04/03/2021   10:53 AM 01/02/2021    3:05 PM  GAD 7 : Generalized Anxiety Score  Nervous, Anxious, on Edge 1 0 0  Control/stop worrying 0 0 0  Worry too much - different things 0 0 0  Trouble relaxing 1 0 0  Restless 0 0 0  Easily annoyed or irritable 0 0 0  Afraid - awful might happen 0 0 0  Total GAD 7 Score 2 0 0  Anxiety Difficulty Not difficult at all         04/08/2022    9:23 AM  Depression screen PHQ 2/9  Decreased Interest 0  Down, Depressed, Hopeless 0  PHQ - 2 Score 0  Altered sleeping 0  Tired, decreased energy 1  Change in appetite 0  Feeling bad or failure about yourself  0  Trouble concentrating 0  Moving  slowly or fidgety/restless 0  Suicidal thoughts 0  PHQ-9 Score 1  Difficult doing work/chores Not difficult at all    BP Readings from Last 3 Encounters:  04/08/22 122/76  10/15/21 118/80  04/03/21 122/82    Physical Exam Vitals and nursing note reviewed.  Constitutional:      General: She is not in acute distress.    Appearance: She is well-developed.  HENT:     Head: Normocephalic and atraumatic.     Right Ear: Tympanic membrane and ear canal normal.     Left Ear: Tympanic membrane and ear canal normal.     Nose:     Right Sinus: No maxillary sinus tenderness.     Left Sinus: No maxillary sinus tenderness.  Eyes:     General: No scleral icterus.       Right eye: No discharge.        Left eye: No discharge.  Conjunctiva/sclera: Conjunctivae normal.  Neck:     Thyroid: No thyromegaly.     Vascular: No carotid bruit.  Cardiovascular:     Rate and Rhythm: Normal rate and regular rhythm.     Pulses: Normal pulses.     Heart sounds: Normal heart sounds.  Pulmonary:     Effort: Pulmonary effort is normal. No respiratory distress.     Breath sounds: No wheezing.  Chest:  Breasts:    Right: No mass, nipple discharge, skin change or tenderness.     Left: No mass, nipple discharge, skin change or tenderness.  Abdominal:     General: Bowel sounds are normal.     Palpations: Abdomen is soft.     Tenderness: There is no abdominal tenderness.       Comments: 1/2 cm smooth round mobile non tender mass c/w cyst vs lymph node  Musculoskeletal:     Cervical back: Normal range of motion. No erythema.     Right lower leg: No edema.     Left lower leg: No edema.  Lymphadenopathy:     Cervical: No cervical adenopathy.  Skin:    General: Skin is warm and dry.     Capillary Refill: Capillary refill takes less than 2 seconds.     Findings: No rash.  Neurological:     General: No focal deficit present.     Mental Status: She is alert and oriented to person, place, and time.      Cranial Nerves: No cranial nerve deficit.     Sensory: No sensory deficit.     Deep Tendon Reflexes: Reflexes are normal and symmetric.  Psychiatric:        Attention and Perception: Attention normal.        Mood and Affect: Mood normal.    Wt Readings from Last 3 Encounters:  04/08/22 129 lb (58.5 kg)  10/15/21 131 lb 3.2 oz (59.5 kg)  04/03/21 130 lb (59 kg)    BP 122/76   Pulse 83   Ht 5\' 5"  (1.651 m)   Wt 129 lb (58.5 kg)   LMP 02/04/2018 (Approximate)   SpO2 95%   BMI 21.47 kg/m   Assessment and Plan: 1. Annual physical exam Normal exam. Up to date on screenings and immunizations. Continues on phentermine for maintenance of weight - CBC with Differential/Platelet - Comprehensive metabolic panel - Lipid panel - TSH  2. Behcet's disease (HCC) Stable - no currently on treatment but followed by Rheumatology  3. Mild intermittent asthma without complication Occasional sx treated with albuterol inhaler PRN  4. Groin mass in female Suspect this is a benign cyst - she will monitor and call if enlarging. No obvious cause noted   Partially dictated using Animal nutritionist. Any errors are unintentional.  Bari Edward, MD [facility]  04/08/2022

## 2022-04-09 LAB — COMPREHENSIVE METABOLIC PANEL
ALT: 22 IU/L (ref 0–32)
AST: 18 IU/L (ref 0–40)
Albumin/Globulin Ratio: 2.2 (ref 1.2–2.2)
Albumin: 4.6 g/dL (ref 3.8–4.8)
Alkaline Phosphatase: 49 IU/L (ref 44–121)
BUN/Creatinine Ratio: 17 (ref 9–23)
BUN: 15 mg/dL (ref 6–20)
Bilirubin Total: 0.3 mg/dL (ref 0.0–1.2)
CO2: 26 mmol/L (ref 20–29)
Calcium: 9.6 mg/dL (ref 8.7–10.2)
Chloride: 103 mmol/L (ref 96–106)
Creatinine, Ser: 0.89 mg/dL (ref 0.57–1.00)
Globulin, Total: 2.1 g/dL (ref 1.5–4.5)
Glucose: 67 mg/dL — ABNORMAL LOW (ref 70–99)
Potassium: 4.3 mmol/L (ref 3.5–5.2)
Sodium: 141 mmol/L (ref 134–144)
Total Protein: 6.7 g/dL (ref 6.0–8.5)
eGFR: 86 mL/min/{1.73_m2} (ref >59)

## 2022-04-09 LAB — CBC WITH DIFFERENTIAL/PLATELET
Basophils Absolute: 0.1 10*3/uL (ref 0.0–0.2)
Basos: 1 %
EOS (ABSOLUTE): 0.1 10*3/uL (ref 0.0–0.4)
Eos: 2 %
Hematocrit: 36.9 % (ref 34.0–46.6)
Hemoglobin: 12.9 g/dL (ref 11.1–15.9)
Immature Grans (Abs): 0 10*3/uL (ref 0.0–0.1)
Immature Granulocytes: 0 %
Lymphocytes Absolute: 2.5 10*3/uL (ref 0.7–3.1)
Lymphs: 41 %
MCH: 33.9 pg — ABNORMAL HIGH (ref 26.6–33.0)
MCHC: 35 g/dL (ref 31.5–35.7)
MCV: 97 fL (ref 79–97)
Monocytes Absolute: 0.3 10*3/uL (ref 0.1–0.9)
Monocytes: 6 %
Neutrophils Absolute: 3 10*3/uL (ref 1.4–7.0)
Neutrophils: 50 %
Platelets: 204 10*3/uL (ref 150–450)
RBC: 3.8 x10E6/uL (ref 3.77–5.28)
RDW: 12.6 % (ref 11.7–15.4)
WBC: 6.1 10*3/uL (ref 3.4–10.8)

## 2022-04-09 LAB — LIPID PANEL
Chol/HDL Ratio: 3.2 ratio (ref 0.0–4.4)
Cholesterol, Total: 228 mg/dL — ABNORMAL HIGH (ref 100–199)
HDL: 72 mg/dL (ref >39)
LDL Chol Calc (NIH): 134 mg/dL — ABNORMAL HIGH (ref 0–99)
Triglycerides: 128 mg/dL (ref 0–149)
VLDL Cholesterol Cal: 22 mg/dL (ref 5–40)

## 2022-04-09 LAB — TSH: TSH: 1.99 u[IU]/mL (ref 0.450–4.500)

## 2022-07-11 DIAGNOSIS — Z8639 Personal history of other endocrine, nutritional and metabolic disease: Secondary | ICD-10-CM | POA: Diagnosis not present

## 2022-07-11 DIAGNOSIS — M545 Low back pain, unspecified: Secondary | ICD-10-CM | POA: Diagnosis not present

## 2022-07-11 DIAGNOSIS — E785 Hyperlipidemia, unspecified: Secondary | ICD-10-CM | POA: Diagnosis not present

## 2022-07-11 DIAGNOSIS — Z8719 Personal history of other diseases of the digestive system: Secondary | ICD-10-CM | POA: Diagnosis not present

## 2023-01-06 DIAGNOSIS — G8929 Other chronic pain: Secondary | ICD-10-CM | POA: Diagnosis not present

## 2023-01-06 DIAGNOSIS — E785 Hyperlipidemia, unspecified: Secondary | ICD-10-CM | POA: Diagnosis not present

## 2023-01-06 DIAGNOSIS — J4520 Mild intermittent asthma, uncomplicated: Secondary | ICD-10-CM | POA: Diagnosis not present

## 2023-01-06 DIAGNOSIS — M545 Low back pain, unspecified: Secondary | ICD-10-CM | POA: Diagnosis not present

## 2023-01-23 ENCOUNTER — Encounter: Payer: Self-pay | Admitting: Family Medicine

## 2023-01-23 ENCOUNTER — Ambulatory Visit (INDEPENDENT_AMBULATORY_CARE_PROVIDER_SITE_OTHER): Payer: BC Managed Care – PPO | Admitting: Family Medicine

## 2023-01-23 VITALS — BP 112/78 | HR 102 | Ht 65.0 in | Wt 136.0 lb

## 2023-01-23 DIAGNOSIS — K130 Diseases of lips: Secondary | ICD-10-CM | POA: Diagnosis not present

## 2023-01-23 MED ORDER — CLOTRIMAZOLE-BETAMETHASONE 1-0.05 % EX CREA: 1.0000 | TOPICAL_CREAM | Freq: Every day | CUTANEOUS | 1 refills | Status: DC

## 2023-01-23 NOTE — Progress Notes (Signed)
Date:  01/23/2023   Name:  Melanie Choi   DOB:  May 25, 1986   MRN:  [id number]   Chief Complaint: chapped lips (Lips swollen and chapped x 2 weeks- tried aqua-4, drinking water, taking fish oil- cracks in corners and on top of lip)  HPI  Lab Results  Component Value Date   NA 141 04/08/2022   K 4.3 04/08/2022   CO2 26 04/08/2022   GLUCOSE 67 (L) 04/08/2022   BUN 15 04/08/2022   CREATININE 0.89 04/08/2022   CALCIUM 9.6 04/08/2022   EGFR 86 04/08/2022   Lab Results  Component Value Date   CHOL 228 (H) 04/08/2022   HDL 72 04/08/2022   LDLCALC 134 (H) 04/08/2022   TRIG 128 04/08/2022   CHOLHDL 3.2 04/08/2022   Lab Results  Component Value Date   TSH 1.990 04/08/2022   Lab Results  Component Value Date   HGBA1C 5.1 04/03/2021   Lab Results  Component Value Date   WBC 6.1 04/08/2022   HGB 12.9 04/08/2022   HCT 36.9 04/08/2022   MCV 97 04/08/2022   PLT 204 04/08/2022   Lab Results  Component Value Date   ALT 22 04/08/2022   AST 18 04/08/2022   ALKPHOS 49 04/08/2022   BILITOT 0.3 04/08/2022   No results found for: "25OHVITD2", "25OHVITD3", "VD25OH"   Review of Systems  Constitutional:  Positive for unexpected weight change.  HENT:  Negative for trouble swallowing.   Eyes:  Negative for visual disturbance.  Respiratory:  Negative for chest tightness and shortness of breath.   Cardiovascular:  Negative for chest pain and palpitations.  Gastrointestinal:  Negative for blood in stool.  Endocrine: Negative for polydipsia and polyuria.  Genitourinary:  Negative for difficulty urinating and menstrual problem.    Patient Active Problem List   Diagnosis Date Noted   Symptomatic mammary hypertrophy 10/15/2021   Back pain 10/15/2021   Elevated blood sugar 05/14/2021   Behcet's disease ([place]) 03/04/2021   S/P total hysterectomy 01/02/2021   Genital labial ulcer 01/02/2021   Mild intermittent asthma without complication [id number]   Irritable bowel syndrome  with constipation 01/19/2020   Chronic midline low back pain without sciatica 01/12/2018   Chronic midline thoracic back pain 01/12/2018   GERD without esophagitis 01/12/2018   Mixed hyperlipidemia 01/12/2018   History of peptic ulcer 01/12/2018   Separation of left acromioclavicular joint 08/30/2017    Allergies  Allergen Reactions   Morphine And Related Anaphylaxis   Levonorgestrel-Ethinyl Estrad    Other Rash    IF LEFT ON FOR LONG PERIODS OF TIME   Percocet [Oxycodone-Acetaminophen] Rash   Tape Rash    IF LEFT ON FOR LONG PERIODS OF TIME    Past Surgical History:  Procedure Laterality Date   ABLATION     BREAST BIOPSY Left 07/27/2019   CESAREAN SECTION     X2   COLONOSCOPY  08/2019   LAPAROSCOPIC ASSISTED VAGINAL HYSTERECTOMY N/A 02/15/2018   Procedure: LAPAROSCOPIC ASSISTED VAGINAL HYSTERECTOMY;  Surgeon: Harlin Heys, MD;  Location: [facility];  Service: Gynecology;  Laterality: N/A;   LAPAROSCOPIC BILATERAL SALPINGECTOMY Bilateral 02/15/2018   Procedure: LAPAROSCOPIC BILATERAL SALPINGECTOMY;  Surgeon: Harlin Heys, MD;  Location: [facility];  Service: Gynecology;  Laterality: Bilateral;   LAPAROSCOPIC TOTAL HYSTERECTOMY  2019   REDUCTION MAMMAPLASTY  2011   SHOULDER SURGERY Right    TUBAL LIGATION  2015   UPPER GI ENDOSCOPY      Social History   Tobacco  Use   Smoking status: Never   Smokeless tobacco: Never  Vaping Use   Vaping Use: Never used  Substance Use Topics   Alcohol use: Not Currently   Drug use: No     Medication list has been reviewed and updated.  Current Meds  Medication Sig   BIOTIN PO Take by mouth daily.   Multiple Vitamin (MULTI-VITAMIN) tablet Take by mouth.   Omega-3 Fatty Acids (FISH OIL PO) Take by mouth daily.   phentermine 15 MG capsule Take 15 mg by mouth every morning.       01/23/2023    9:32 AM 04/08/2022    9:24 AM 04/03/2021   10:53 AM 01/02/2021    3:05 PM  GAD 7 : Generalized Anxiety Score  Nervous, Anxious, on  Edge 0 1 0 0  Control/stop worrying 0 0 0 0  Worry too much - different things 0 0 0 0  Trouble relaxing 0 1 0 0  Restless 0 0 0 0  Easily annoyed or irritable 0 0 0 0  Afraid - awful might happen 0 0 0 0  Total GAD 7 Score 0 2 0 0  Anxiety Difficulty Not difficult at all Not difficult at all         01/23/2023    9:32 AM 04/08/2022    9:23 AM 04/03/2021   10:52 AM  Depression screen PHQ 2/9  Decreased Interest 0 0 0  Down, Depressed, Hopeless 0 0 0  PHQ - 2 Score 0 0 0  Altered sleeping 0 0 0  Tired, decreased energy 0 1 0  Change in appetite 0 0 0  Feeling bad or failure about yourself  0 0 0  Trouble concentrating 0 0 0  Moving slowly or fidgety/restless 0 0 0  Suicidal thoughts 0 0 0  PHQ-9 Score 0 1 0  Difficult doing work/chores Not difficult at all Not difficult at all     BP Readings from Last 3 Encounters:  01/23/23 112/78  04/08/22 122/76  10/15/21 118/80    Physical Exam Vitals and nursing note reviewed. Exam conducted with a chaperone present.  Constitutional:      General: She is not in acute distress.    Appearance: She is not diaphoretic.  HENT:     Head: Normocephalic and atraumatic.     Right Ear: External ear normal.     Left Ear: External ear normal.     Nose: Nose normal.  Eyes:     General:        Right eye: No discharge.        Left eye: No discharge.     Conjunctiva/sclera: Conjunctivae normal.     Pupils: Pupils are equal, round, and reactive to light.  Neck:     Thyroid: No thyromegaly.     Vascular: No JVD.  Cardiovascular:     Rate and Rhythm: Normal rate and regular rhythm.     Heart sounds: Normal heart sounds. No murmur heard.    No friction rub. No gallop.  Pulmonary:     Effort: Pulmonary effort is normal.     Breath sounds: Normal breath sounds.  Abdominal:     General: Bowel sounds are normal.     Palpations: Abdomen is soft. There is no mass.     Tenderness: There is no abdominal tenderness. There is no guarding.   Musculoskeletal:        General: Normal range of motion.     Cervical back: Neck  supple.  Lymphadenopathy:     Cervical: No cervical adenopathy.  Skin:    General: Skin is warm and dry.  Neurological:     Mental Status: She is alert.     Wt Readings from Last 3 Encounters:  01/23/23 136 lb (61.7 kg)  04/08/22 129 lb (58.5 kg)  10/15/21 131 lb 3.2 oz (59.5 kg)    BP 112/78   Pulse (!) 102   Ht '5\' 5"'$  (1.651 m)   Wt 136 lb (61.7 kg)   LMP 02/04/2018 (Approximate)   SpO2 98%   BMI 22.63 kg/m   Assessment and Plan:  1. Angular cheilitis New onset.  Persistent.  Stable.  But unresolved with over-the-counter preparations.  Has some angular cheilitis features to it and that it has not resolved on moisturization and patient does not lick her lips or mouth breathes at night.  There is no new dental apparatus that is put in although that there was a dental surgery that was done relatively recently and there is a little "bubble "at the base of it to patient does have history of Behcet's which may represent an autoimmune mucosal.  In any event patient does need a dermatologist she is fair skinned and will need to have surveillance and we will refer to dermatology for follow-up of the cheilitis and/or later dermatologic concerns. - Ambulatory referral to Dermatology    Otilio Miu, MD

## 2023-04-14 ENCOUNTER — Encounter: Payer: Self-pay | Admitting: Internal Medicine

## 2023-04-14 ENCOUNTER — Ambulatory Visit (INDEPENDENT_AMBULATORY_CARE_PROVIDER_SITE_OTHER): Payer: BC Managed Care – PPO | Admitting: Internal Medicine

## 2023-04-14 VITALS — BP 112/70 | HR 93 | Ht 65.0 in | Wt 134.0 lb

## 2023-04-14 DIAGNOSIS — L710 Perioral dermatitis: Secondary | ICD-10-CM

## 2023-04-14 DIAGNOSIS — E782 Mixed hyperlipidemia: Secondary | ICD-10-CM

## 2023-04-14 DIAGNOSIS — M352 Behcet's disease: Secondary | ICD-10-CM | POA: Diagnosis not present

## 2023-04-14 DIAGNOSIS — R739 Hyperglycemia, unspecified: Secondary | ICD-10-CM

## 2023-04-14 DIAGNOSIS — Z0000 Encounter for general adult medical examination without abnormal findings: Secondary | ICD-10-CM | POA: Diagnosis not present

## 2023-04-14 MED ORDER — TRIAMCINOLONE ACETONIDE 0.025 % EX OINT
1.0000 | TOPICAL_OINTMENT | Freq: Two times a day (BID) | CUTANEOUS | 0 refills | Status: DC
Start: 2023-04-14 — End: 2024-04-28

## 2023-04-14 NOTE — Assessment & Plan Note (Signed)
Controlled with diet and exercise Lab Results  Component Value Date   LDLCALC 134 (H) 04/08/2022

## 2023-04-14 NOTE — Assessment & Plan Note (Signed)
Minimal response to lotrisone cream Will try lower dose steroid ointment and Dermatology referral

## 2023-04-14 NOTE — Assessment & Plan Note (Signed)
Continue low carb diet. Lab Results  Component Value Date   HGBA1C 5.1 04/03/2021

## 2023-04-14 NOTE — Assessment & Plan Note (Signed)
Followed by Rheumatology. Not currently on treatment.

## 2023-04-14 NOTE — Progress Notes (Signed)
Date:  04/14/2023   Name:  Melanie Choi   DOB:  10/10/1986   MRN:  [id number]   Chief Complaint: Annual Exam (Breast exam no pap ) Melanie Choi is a 37 y.o. female who presents today for her Complete Annual Exam. She feels well. She reports exercising - walking. She reports she is sleeping well. Breast complaints - none.  Mammogram: not due Pap smear: discontinued   Health Maintenance Due  Topic Date Due   COVID-19 Vaccine (1) Never done   HIV Screening  Never done    Immunization History  Administered Date(s) Administered   Influenza,inj,Quad PF,6+ Mos 01/12/2018, 09/16/2018   Influenza-Unspecified 09/16/2018   Tdap 12/01/2013    Rash This is a new problem. The current episode started 1 to 4 weeks ago. The problem is unchanged. The affected locations include the lips. Pertinent negatives include no congestion, cough, diarrhea, fatigue, fever, shortness of breath or vomiting.    Lab Results  Component Value Date   NA 141 04/08/2022   K 4.3 04/08/2022   CO2 26 04/08/2022   GLUCOSE 67 (L) 04/08/2022   BUN 15 04/08/2022   CREATININE 0.89 04/08/2022   CALCIUM 9.6 04/08/2022   EGFR 86 04/08/2022   Lab Results  Component Value Date   CHOL 228 (H) 04/08/2022   HDL 72 04/08/2022   LDLCALC 134 (H) 04/08/2022   TRIG 128 04/08/2022   CHOLHDL 3.2 04/08/2022   Lab Results  Component Value Date   TSH 1.990 04/08/2022   Lab Results  Component Value Date   HGBA1C 5.1 04/03/2021   Lab Results  Component Value Date   WBC 6.1 04/08/2022   HGB 12.9 04/08/2022   HCT 36.9 04/08/2022   MCV 97 04/08/2022   PLT 204 04/08/2022   Lab Results  Component Value Date   ALT 22 04/08/2022   AST 18 04/08/2022   ALKPHOS 49 04/08/2022   BILITOT 0.3 04/08/2022   No results found for: "25OHVITD2", "25OHVITD3", "VD25OH"   Review of Systems  Constitutional:  Negative for chills, fatigue and fever.  HENT:  Negative for congestion, hearing loss, tinnitus, trouble swallowing  and voice change.   Eyes:  Negative for visual disturbance.  Respiratory:  Negative for cough, chest tightness, shortness of breath and wheezing.   Cardiovascular:  Negative for chest pain, palpitations and leg swelling.  Gastrointestinal:  Negative for abdominal pain, constipation, diarrhea and vomiting.  Endocrine: Negative for polydipsia and polyuria.  Genitourinary:  Negative for dysuria, frequency, genital sores, vaginal bleeding and vaginal discharge.  Musculoskeletal:  Negative for arthralgias, gait problem and joint swelling.  Skin:  Positive for rash. Negative for color change.  Neurological:  Negative for dizziness, tremors, light-headedness and headaches.  Hematological:  Negative for adenopathy. Does not bruise/bleed easily.  Psychiatric/Behavioral:  Negative for dysphoric mood and sleep disturbance. The patient is not nervous/anxious.     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   S/P total hysterectomy 01/02/2021   Mild intermittent asthma without complication 01/02/2021   Irritable bowel syndrome with constipation 01/19/2020   Chronic midline low back pain without sciatica 01/12/2018   Chronic midline thoracic back pain 01/12/2018   GERD without esophagitis 01/12/2018   Mixed hyperlipidemia 01/12/2018   History of peptic ulcer 01/12/2018   Separation of left acromioclavicular joint 08/30/2017  Allergies  Allergen Reactions   Morphine And Related Anaphylaxis   Oxycodone-Acetaminophen Rash and Hives   Levonorgestrel-Ethinyl Estrad    Other Rash    IF LEFT ON FOR LONG PERIODS OF TIME   Tape Rash    IF LEFT ON FOR LONG PERIODS OF TIME    Past Surgical History:  Procedure Laterality Date   ABLATION     BREAST BIOPSY Left 07/27/2019   CESAREAN SECTION     X2   COLONOSCOPY   08/2019   LAPAROSCOPIC ASSISTED VAGINAL HYSTERECTOMY N/A 02/15/2018   Procedure: LAPAROSCOPIC ASSISTED VAGINAL HYSTERECTOMY;  Surgeon: Linzie Collin, MD;  Location: [facility] ORS;  Service: Gynecology;  Laterality: N/A;   LAPAROSCOPIC BILATERAL SALPINGECTOMY Bilateral 02/15/2018   Procedure: LAPAROSCOPIC BILATERAL SALPINGECTOMY;  Surgeon: Linzie Collin, MD;  Location: [facility] OR[facility];  Service: Gynecology;  Laterality: Bilateral;   LAPAROSCOPIC TOTAL HYSTERECTOMY  2019   REDUCTION MAMMAPLASTY  2011   SHOULDER SURGERY Right    TUBAL LIGATION  2015   UPPER GI ENDOSCOPY      Social History   Tobacco Use   Smoking status: Never   Smokeless tobacco: Never  Vaping Use   Vaping Use: Never used  Substance Use Topics   Alcohol use: Not Currently   Drug use: No     Medication list has been reviewed and updated.  Current Meds  Medication Sig   BIOTIN PO Take by mouth daily.   clotrimazole-betamethasone (LOTRISONE) cream Apply 1 Application topically daily.   ibuprofen (ADVIL) 600 MG tablet Take 600 mg by mouth every 6 (six) hours as needed.   Multiple Vitamin (MULTI-VITAMIN) tablet Take by mouth.   Omega-3 Fatty Acids (FISH OIL PO) Take by mouth daily.   phentermine 15 MG capsule Take 15 mg by mouth every morning.   triamcinolone (KENALOG) 0.025 % ointment Apply 1 Application topically 2 (two) times daily.   [DISCONTINUED] meloxicam (MOBIC) 15 MG tablet Take by mouth.       04/14/2023    8:36 AM 01/23/2023    9:32 AM 04/08/2022    9:24 AM 04/03/2021   10:53 AM  GAD 7 : Generalized Anxiety Score  Nervous, Anxious, on Edge 0 0 1 0  Control/stop worrying 0 0 0 0  Worry too much - different things 0 0 0 0  Trouble relaxing 0 0 1 0  Restless 0 0 0 0  Easily annoyed or irritable 0 0 0 0  Afraid - awful might happen 0 0 0 0  Total GAD 7 Score 0 0 2 0  Anxiety Difficulty Not difficult at all Not difficult at all Not difficult at all        04/14/2023    8:36 AM 01/23/2023    9:32 AM  04/08/2022    9:23 AM  Depression screen PHQ 2/9  Decreased Interest 0 0 0  Down, Depressed, Hopeless 0 0 0  PHQ - 2 Score 0 0 0  Altered sleeping 1 0 0  Tired, decreased energy 1 0 1  Change in appetite 0 0 0  Feeling bad or failure about yourself  0 0 0  Trouble concentrating 0 0 0  Moving slowly or fidgety/restless 0 0 0  Suicidal thoughts 0 0 0  PHQ-9 Score 2 0 1  Difficult doing work/chores Not difficult at all Not difficult at all Not difficult at all    BP Readings from Last 3 Encounters:  04/14/23 112/70  01/23/23 112/78  04/08/22 122/76  Physical Exam Vitals and nursing note reviewed.  Constitutional:      General: She is not in acute distress.    Appearance: She is well-developed.  HENT:     Head: Normocephalic and atraumatic.     Right Ear: Tympanic membrane and ear canal normal.     Left Ear: Tympanic membrane and ear canal normal.     Nose:     Right Sinus: No maxillary sinus tenderness.     Left Sinus: No maxillary sinus tenderness.     Mouth/Throat:     Lips: Pink. Lesions (dry red chapped appearing) present.  Eyes:     General: No scleral icterus.       Right eye: No discharge.        Left eye: No discharge.     Conjunctiva/sclera: Conjunctivae normal.  Neck:     Thyroid: No thyromegaly.     Vascular: No carotid bruit.  Cardiovascular:     Rate and Rhythm: Normal rate and regular rhythm.     Pulses: Normal pulses.     Heart sounds: Normal heart sounds.  Pulmonary:     Effort: Pulmonary effort is normal. No respiratory distress.     Breath sounds: No wheezing.  Chest:  Breasts:    Right: No mass, nipple discharge, skin change or tenderness.     Left: No mass, nipple discharge, skin change or tenderness.     Comments: Breast reduction scars Abdominal:     General: Bowel sounds are normal.     Palpations: Abdomen is soft.     Tenderness: There is no abdominal tenderness.  Musculoskeletal:     Cervical back: Normal range of motion. No  erythema.     Right lower leg: No edema.     Left lower leg: No edema.  Lymphadenopathy:     Cervical: No cervical adenopathy.  Skin:    General: Skin is warm and dry.     Capillary Refill: Capillary refill takes less than 2 seconds.     Findings: No rash.  Neurological:     General: No focal deficit present.     Mental Status: She is alert and oriented to person, place, and time.     Cranial Nerves: No cranial nerve deficit.     Sensory: No sensory deficit.     Deep Tendon Reflexes: Reflexes are normal and symmetric.  Psychiatric:        Attention and Perception: Attention normal.        Mood and Affect: Mood normal.     Wt Readings from Last 3 Encounters:  04/14/23 134 lb (60.8 kg)  01/23/23 136 lb (61.7 kg)  04/08/22 129 lb (58.5 kg)    BP 112/70   Pulse 93   Ht 5\' 5"  (1.651 m)   Wt 134 lb (60.8 kg)   LMP 02/04/2018 (Approximate)   SpO2 99%   BMI 22.30 kg/m   Assessment and Plan:  Problem List Items Addressed This Visit     Elevated blood sugar    Continue low carb diet. Lab Results  Component Value Date   HGBA1C 5.1 04/03/2021        Relevant Orders   Comprehensive metabolic panel   Hemoglobin A1c   Perioral dermatitis    Minimal response to lotrisone cream Will try lower dose steroid ointment and Dermatology referral      Behcet's disease (HCC) (Chronic)    Followed by Rheumatology. Not currently on treatment.      Relevant Medications  triamcinolone (KENALOG) 0.025 % ointment   Other Relevant Orders   CBC with Differential/Platelet   Mixed hyperlipidemia (Chronic)    Controlled with diet and exercise Lab Results  Component Value Date   LDLCALC 134 (H) 04/08/2022        Relevant Orders   Lipid panel   Other Visit Diagnoses     Annual physical exam    -  Primary   contnue healthy diet and exercise Phentermine intermittently   Relevant Orders   CBC with Differential/Platelet   Comprehensive metabolic panel   Lipid panel    Hemoglobin A1c   TSH       No follow-ups on file.   Partially dictated using Dragon software, any errors are not intentional.  Reubin Milan, MD [facility] [place], [place]

## 2023-04-15 LAB — CBC WITH DIFFERENTIAL/PLATELET
Basophils Absolute: 0.1 10*3/uL (ref 0.0–0.2)
Basos: 2 %
EOS (ABSOLUTE): 0.1 10*3/uL (ref 0.0–0.4)
Eos: 3 %
Hematocrit: 37.5 % (ref 34.0–46.6)
Hemoglobin: 12.5 g/dL (ref 11.1–15.9)
Immature Grans (Abs): 0 10*3/uL (ref 0.0–0.1)
Immature Granulocytes: 0 %
Lymphocytes Absolute: 2.5 10*3/uL (ref 0.7–3.1)
Lymphs: 52 %
MCH: 32.4 pg (ref 26.6–33.0)
MCHC: 33.3 g/dL (ref 31.5–35.7)
MCV: 97 fL (ref 79–97)
Monocytes Absolute: 0.3 10*3/uL (ref 0.1–0.9)
Monocytes: 7 %
Neutrophils Absolute: 1.7 10*3/uL (ref 1.4–7.0)
Neutrophils: 36 %
Platelets: 222 10*3/uL (ref 150–450)
RBC: 3.86 x10E6/uL (ref 3.77–5.28)
RDW: 12.6 % (ref 11.7–15.4)
WBC: 4.8 10*3/uL (ref 3.4–10.8)

## 2023-04-15 LAB — COMPREHENSIVE METABOLIC PANEL
ALT: 20 IU/L (ref 0–32)
AST: 16 IU/L (ref 0–40)
Albumin/Globulin Ratio: 2.2 (ref 1.2–2.2)
Albumin: 4.7 g/dL (ref 3.9–4.9)
Alkaline Phosphatase: 43 IU/L — ABNORMAL LOW (ref 44–121)
BUN/Creatinine Ratio: 14 (ref 9–23)
BUN: 11 mg/dL (ref 6–20)
Bilirubin Total: 0.3 mg/dL (ref 0.0–1.2)
CO2: 26 mmol/L (ref 20–29)
Calcium: 9.7 mg/dL (ref 8.7–10.2)
Chloride: 104 mmol/L (ref 96–106)
Creatinine, Ser: 0.79 mg/dL (ref 0.57–1.00)
Globulin, Total: 2.1 g/dL (ref 1.5–4.5)
Glucose: 80 mg/dL (ref 70–99)
Potassium: 3.9 mmol/L (ref 3.5–5.2)
Sodium: 142 mmol/L (ref 134–144)
Total Protein: 6.8 g/dL (ref 6.0–8.5)
eGFR: 99 mL/min/{1.73_m2} (ref >59)

## 2023-04-15 LAB — LIPID PANEL
Chol/HDL Ratio: 3.3 ratio (ref 0.0–4.4)
Cholesterol, Total: 215 mg/dL — ABNORMAL HIGH (ref 100–199)
HDL: 65 mg/dL (ref >39)
LDL Chol Calc (NIH): 131 mg/dL — ABNORMAL HIGH (ref 0–99)
Triglycerides: 105 mg/dL (ref 0–149)
VLDL Cholesterol Cal: 19 mg/dL (ref 5–40)

## 2023-04-15 LAB — HEMOGLOBIN A1C
Est. average glucose Bld gHb Est-mCnc: 100 mg/dL
Hgb A1c MFr Bld: 5.1 % (ref 4.8–5.6)

## 2023-04-15 LAB — TSH: TSH: 2.44 u[IU]/mL (ref 0.450–4.500)

## 2023-04-15 NOTE — Progress Notes (Signed)
PC to pt LVM to call with any questions or concerns. Per DPR can leave detailed message on vm

## 2023-11-03 DIAGNOSIS — R635 Abnormal weight gain: Secondary | ICD-10-CM | POA: Diagnosis not present

## 2023-11-03 DIAGNOSIS — M545 Low back pain, unspecified: Secondary | ICD-10-CM | POA: Diagnosis not present

## 2023-11-03 DIAGNOSIS — J4520 Mild intermittent asthma, uncomplicated: Secondary | ICD-10-CM | POA: Diagnosis not present

## 2023-11-03 DIAGNOSIS — E785 Hyperlipidemia, unspecified: Secondary | ICD-10-CM | POA: Diagnosis not present

## 2023-11-20 DIAGNOSIS — M5412 Radiculopathy, cervical region: Secondary | ICD-10-CM | POA: Diagnosis not present

## 2023-12-08 ENCOUNTER — Ambulatory Visit (INDEPENDENT_AMBULATORY_CARE_PROVIDER_SITE_OTHER): Payer: BC Managed Care – PPO | Admitting: Dermatology

## 2023-12-08 ENCOUNTER — Ambulatory Visit: Payer: BC Managed Care – PPO | Admitting: Dermatology

## 2023-12-08 DIAGNOSIS — D485 Neoplasm of uncertain behavior of skin: Secondary | ICD-10-CM

## 2023-12-08 DIAGNOSIS — D492 Neoplasm of unspecified behavior of bone, soft tissue, and skin: Secondary | ICD-10-CM | POA: Diagnosis not present

## 2023-12-08 DIAGNOSIS — K130 Diseases of lips: Secondary | ICD-10-CM

## 2023-12-08 DIAGNOSIS — L980 Pyogenic granuloma: Secondary | ICD-10-CM | POA: Diagnosis not present

## 2023-12-08 MED ORDER — TACROLIMUS 0.1 % EX OINT: TOPICAL_OINTMENT | CUTANEOUS | 1 refills | Status: AC

## 2023-12-08 NOTE — Progress Notes (Signed)
   New Patient Visit   Subjective  Melanie Choi is a 38 y.o. female who presents for the following: rash of the upper lip with itching, burning, and dryness x 9 months. She has started having a reaction to Aquaphor ointment, but is ok with the Aquaphor stick or Carmex. No new lipstick, lip gloss, detergents, beauty products, toothpaste, or candies. Lips never completely heal. She has used triamcinolone  0.025% ointment, which helps, but hasn't used in a couple of months.  She also has a spot on her left lower back that came up about 4-5 weeks ago, has gotten larger since then. Area has bled a little and jeans/pants rub.    The following portions of the chart were reviewed this encounter and updated as appropriate: medications, allergies, medical history  Review of Systems:  No other skin or systemic complaints except as noted in HPI or Assessment and Plan.  Objective  Well appearing patient in no apparent distress; mood and affect are within normal limits.  A focused examination was performed of the following areas: Face, trunk  Relevant physical exam findings are noted in the Assessment and Plan.  Left lower back 8.0 mm pink/red nodule with crusting.   Assessment & Plan   CHEILITIS Exam Mild erythema and edema of the upper lip vermilion border.  Treatment Plan Avoid licking/biting lips d/c Carmex and Aquaphor, start vanicream ointment prn during day Start tacrolimus  ointment Apply to lips twice a day until improved.  Once improved, may add lip products back one by one.  NEOPLASM OF UNCERTAIN BEHAVIOR OF SKIN Left lower back Epidermal / dermal shaving  Lesion diameter (cm):  0.8 Informed consent: discussed and consent obtained   Patient was prepped and draped in usual sterile fashion: Area prepped with alcohol. Anesthesia: the lesion was anesthetized in a standard fashion   Anesthetic:  1% lidocaine  w/ epinephrine 1-100,000 buffered w/ 8.4% NaHCO3 Instrument used:  flexible razor blade   Hemostasis achieved with: pressure, aluminum chloride and electrodesiccation   Outcome: patient tolerated procedure well   Post-procedure details: wound care instructions given   Post-procedure details comment:  Ointment and small bandage applied Specimen 1 - Surgical pathology Differential Diagnosis: Pyogenic Granuloma vs other Check Margins: No   Return if symptoms worsen or fail to improve.  IAndrea Kerns, CMA, am acting as scribe for Rexene Rattler, MD .   Documentation: I have reviewed the above documentation for accuracy and completeness, and I agree with the above.  Rexene Rattler, MD

## 2023-12-08 NOTE — Patient Instructions (Signed)

## 2023-12-09 LAB — SURGICAL PATHOLOGY

## 2023-12-10 ENCOUNTER — Telehealth: Payer: Self-pay

## 2023-12-10 NOTE — Telephone Encounter (Signed)
 Advised pt of bx result/sh ?

## 2023-12-10 NOTE — Telephone Encounter (Signed)
-----   Message from Willeen Niece sent at 12/09/2023  8:50 PM EST ----- 1. Skin, left lower back :       PYOGENIC GRANULOMA, THROMBOSED, ULCERATED   Benign vascular growth related to abnormal wound healing, may recur - please call patient

## 2024-04-13 DIAGNOSIS — M5412 Radiculopathy, cervical region: Secondary | ICD-10-CM | POA: Diagnosis not present

## 2024-04-18 DIAGNOSIS — M5412 Radiculopathy, cervical region: Secondary | ICD-10-CM | POA: Diagnosis not present

## 2024-04-28 ENCOUNTER — Encounter: Payer: Self-pay | Admitting: Internal Medicine

## 2024-04-28 ENCOUNTER — Ambulatory Visit (INDEPENDENT_AMBULATORY_CARE_PROVIDER_SITE_OTHER): Payer: BC Managed Care – PPO | Admitting: Internal Medicine

## 2024-04-28 VITALS — BP 110/64 | HR 93 | Ht 65.0 in | Wt 132.1 lb

## 2024-04-28 DIAGNOSIS — E782 Mixed hyperlipidemia: Secondary | ICD-10-CM

## 2024-04-28 DIAGNOSIS — Z8711 Personal history of peptic ulcer disease: Secondary | ICD-10-CM

## 2024-04-28 DIAGNOSIS — Z0000 Encounter for general adult medical examination without abnormal findings: Secondary | ICD-10-CM | POA: Diagnosis not present

## 2024-04-28 DIAGNOSIS — Z23 Encounter for immunization: Secondary | ICD-10-CM

## 2024-04-28 DIAGNOSIS — G959 Disease of spinal cord, unspecified: Secondary | ICD-10-CM | POA: Diagnosis not present

## 2024-04-28 DIAGNOSIS — J4520 Mild intermittent asthma, uncomplicated: Secondary | ICD-10-CM

## 2024-04-28 DIAGNOSIS — M5000 Cervical disc disorder with myelopathy, unspecified cervical region: Secondary | ICD-10-CM | POA: Insufficient documentation

## 2024-04-28 DIAGNOSIS — R739 Hyperglycemia, unspecified: Secondary | ICD-10-CM | POA: Diagnosis not present

## 2024-04-28 NOTE — Assessment & Plan Note (Signed)
 Surgery scheduled for June.

## 2024-04-28 NOTE — Assessment & Plan Note (Signed)
 Managed with diet and exercise.

## 2024-04-28 NOTE — Assessment & Plan Note (Signed)
 Reflux symptoms are controlled on diet only. Patient denies red flag symptoms - no melena, weight loss, dysphagia.

## 2024-04-28 NOTE — Progress Notes (Signed)
 Date:  04/28/2024   Name:  Melanie Choi   DOB:  25-Jan-1986   MRN:  [id number]   Chief Complaint: Annual Exam Melanie Choi is a 39 y.o. female who presents today for her Complete Annual Exam. She feels well. She reports exercising walks, 4 - 5 times a week, 1 mile or so. She reports she is sleeping fairly well. Breast complaints none.  Health Maintenance  Topic Date Due   HIV Screening  Never done   Pneumococcal Vaccination (1 of 2 - PCV) Never done   COVID-19 Vaccine (1) 05/14/2025*   Flu Shot  07/01/2024   DTaP/Tdap/Td vaccine (3 - Td or Tdap) 04/28/2034   Hepatitis C Screening  Completed   HPV Vaccine  Aged Out   Meningitis B Vaccine  Aged Out  *Topic was postponed. The date shown is not the original due date.     Asthma There is no cough, shortness of breath or wheezing. Pertinent negatives include no chest pain, headaches, myalgias or trouble swallowing. Her past medical history is significant for asthma.  Gastroesophageal Reflux She reports no abdominal pain, no chest pain, no coughing or no wheezing. Pertinent negatives include no fatigue.  Hyperlipidemia This is a chronic problem. The problem is uncontrolled. Recent lipid tests were reviewed and are high. Pertinent negatives include no chest pain, myalgias or shortness of breath. Current antihyperlipidemic treatment includes diet change and exercise.  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  She has surgery scheduled at [facility] next month.  On gabapentin with some benefit at night.   Review of Systems  Constitutional:  Negative for fatigue and unexpected weight change.  HENT:  Negative for trouble swallowing.   Eyes:  Negative for visual disturbance.  Respiratory:  Negative for cough, chest tightness, shortness of breath and wheezing.   Cardiovascular:  Negative for chest pain, palpitations and leg swelling.  Gastrointestinal:  Negative for abdominal pain, constipation and diarrhea.  Genitourinary:  Negative for  dysuria, frequency and pelvic pain.  Musculoskeletal:  Positive for neck pain (with radiculopathy left arm). Negative for arthralgias and myalgias.  Neurological:  Positive for weakness (mild LUE weakness). Negative for dizziness, light-headedness, numbness and headaches.  Psychiatric/Behavioral:  Negative for dysphoric mood and sleep disturbance. The patient is not nervous/anxious.      Lab Results  Component Value Date   NA 142 04/14/2023   K 3.9 04/14/2023   CO2 26 04/14/2023   GLUCOSE 80 04/14/2023   BUN 11 04/14/2023   CREATININE 0.79 04/14/2023   CALCIUM 9.7 04/14/2023   EGFR 99 04/14/2023   Lab Results  Component Value Date   CHOL 215 (H) 04/14/2023   HDL 65 04/14/2023   LDLCALC 131 (H) 04/14/2023   TRIG 105 04/14/2023   CHOLHDL 3.3 04/14/2023   Lab Results  Component Value Date   TSH 2.440 04/14/2023   Lab Results  Component Value Date   HGBA1C 5.1 04/14/2023   Lab Results  Component Value Date   WBC 4.8 04/14/2023   HGB 12.5 04/14/2023   HCT 37.5 04/14/2023   MCV 97 04/14/2023   PLT 222 04/14/2023   Lab Results  Component Value Date   ALT 20 04/14/2023   AST 16 04/14/2023   ALKPHOS 43 (L) 04/14/2023   BILITOT 0.3 04/14/2023   No results found for: "25OHVITD2", "25OHVITD3", "VD25OH"   Patient Active Problem List   Diagnosis Date Noted   Cervical disc disease with myelopathy 04/28/2024   Perioral dermatitis 04/14/2023  Symptomatic mammary hypertrophy 10/15/2021   Elevated blood sugar 05/14/2021   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   S/P total hysterectomy 01/02/2021   Mild intermittent asthma without complication 01/02/2021   Irritable bowel syndrome with constipation 01/19/2020   Chronic midline low back pain without sciatica 01/12/2018   Chronic midline thoracic back pain 01/12/2018   GERD without esophagitis 01/12/2018   Mixed hyperlipidemia 01/12/2018   History of peptic ulcer 01/12/2018   Separation of left acromioclavicular joint  08/30/2017    Allergies  Allergen Reactions   Morphine  And Codeine Anaphylaxis   Oxycodone -Acetaminophen  Rash and Hives   Levonorgestrel-Ethinyl Estrad    Other Rash    IF LEFT ON FOR LONG PERIODS OF TIME   Tape Rash    IF LEFT ON FOR LONG PERIODS OF TIME    Past Surgical History:  Procedure Laterality Date   ABLATION     BREAST BIOPSY Left 07/27/2019   CESAREAN SECTION     X2   COLONOSCOPY  08/2019   LAPAROSCOPIC ASSISTED VAGINAL HYSTERECTOMY N/A 02/15/2018   Procedure: LAPAROSCOPIC ASSISTED VAGINAL HYSTERECTOMY;  Surgeon: Zenobia Hila, MD;  Location: [facility] ORS;  Service: Gynecology;  Laterality: N/A;   LAPAROSCOPIC BILATERAL SALPINGECTOMY Bilateral 02/15/2018   Procedure: LAPAROSCOPIC BILATERAL SALPINGECTOMY;  Surgeon: Zenobia Hila, MD;  Location: [facility] ORS;  Service: Gynecology;  Laterality: Bilateral;   LAPAROSCOPIC TOTAL HYSTERECTOMY  2019   REDUCTION MAMMAPLASTY  2011   SHOULDER SURGERY Right    TUBAL LIGATION  2015   UPPER GI ENDOSCOPY      Social History   Tobacco Use   Smoking status: Never   Smokeless tobacco: Never  Vaping Use   Vaping status: Never Used  Substance Use Topics   Alcohol use: Not Currently   Drug use: No     Medication list has been reviewed and updated.  Current Meds  Medication Sig   BIOTIN PO Take by mouth daily.   gabapentin (NEURONTIN) 300 MG capsule Take 300 mg by mouth 2 (two) times daily.   ibuprofen  (ADVIL ) 600 MG tablet Take 600 mg by mouth every 6 (six) hours as needed.   Multiple Vitamin (MULTI-VITAMIN) tablet Take by mouth.   Omega-3 Fatty Acids (FISH OIL PO) Take by mouth daily.   phentermine 15 MG capsule Take 15 mg by mouth every morning. As needed   tacrolimus  (PROTOPIC ) 0.1 % ointment Apply to lips twice a day until improved.       04/28/2024   10:08 AM 04/14/2023    8:36 AM 01/23/2023    9:32 AM 04/08/2022    9:24 AM  GAD 7 : Generalized Anxiety Score  Nervous, Anxious, on Edge 0 0 0 1  Control/stop  worrying 0 0 0 0  Worry too much - different things 1 0 0 0  Trouble relaxing 1 0 0 1  Restless 0 0 0 0  Easily annoyed or irritable 1 0 0 0  Afraid - awful might happen 0 0 0 0  Total GAD 7 Score 3 0 0 2  Anxiety Difficulty Not difficult at all Not difficult at all Not difficult at all Not difficult at all       04/28/2024   10:08 AM 04/14/2023    8:36 AM 01/23/2023    9:32 AM  Depression screen PHQ 2/9  Decreased Interest 0 0 0  Down, Depressed, Hopeless 0 0 0  PHQ - 2 Score 0 0 0  Altered sleeping 3 1 0  Tired, decreased energy  1 1 0  Change in appetite 1 0 0  Feeling bad or failure about yourself  0 0 0  Trouble concentrating 0 0 0  Moving slowly or fidgety/restless 0 0 0  Suicidal thoughts 0 0 0  PHQ-9 Score 5 2 0  Difficult doing work/chores Not difficult at all Not difficult at all Not difficult at all    BP Readings from Last 3 Encounters:  04/28/24 110/64  04/14/23 112/70  01/23/23 112/78    Physical Exam Vitals and nursing note reviewed.  Constitutional:      General: She is not in acute distress.    Appearance: She is well-developed.  HENT:     Head: Normocephalic and atraumatic.     Right Ear: Tympanic membrane and ear canal normal.     Left Ear: Tympanic membrane and ear canal normal.     Nose:     Right Sinus: No maxillary sinus tenderness.     Left Sinus: No maxillary sinus tenderness.  Eyes:     General: No scleral icterus.       Right eye: No discharge.        Left eye: No discharge.     Conjunctiva/sclera: Conjunctivae normal.  Neck:     Thyroid: No thyromegaly.     Vascular: No carotid bruit.  Cardiovascular:     Rate and Rhythm: Normal rate and regular rhythm.     Pulses: Normal pulses.     Heart sounds: Normal heart sounds.  Pulmonary:     Effort: Pulmonary effort is normal. No respiratory distress.     Breath sounds: No wheezing.  Chest:  Breasts:    Right: No nipple discharge, skin change or tenderness.     Left: No nipple  discharge, skin change or tenderness.     Comments: Fibrocystic changes in left breast; no dominant mass Abdominal:     General: Bowel sounds are normal.     Palpations: Abdomen is soft.     Tenderness: There is no abdominal tenderness.  Musculoskeletal:     Cervical back: Normal range of motion. No erythema.     Right lower leg: No edema.     Left lower leg: No edema.  Lymphadenopathy:     Cervical: No cervical adenopathy.  Skin:    General: Skin is warm and dry.     Capillary Refill: Capillary refill takes less than 2 seconds.     Findings: No rash.  Neurological:     Mental Status: She is alert and oriented to person, place, and time.     Cranial Nerves: No cranial nerve deficit.     Sensory: No sensory deficit.     Deep Tendon Reflexes: Reflexes are normal and symmetric.  Psychiatric:        Attention and Perception: Attention normal.        Mood and Affect: Mood and affect normal.     Wt Readings from Last 3 Encounters:  04/28/24 132 lb 2 oz (59.9 kg)  04/14/23 134 lb (60.8 kg)  01/23/23 136 lb (61.7 kg)    BP 110/64   Pulse 93   Ht 5\' 5"  (1.651 m)   Wt 132 lb 2 oz (59.9 kg)   LMP 02/04/2018 (Approximate)   SpO2 99%   BMI 21.99 kg/m   Assessment and Plan:  Problem List Items Addressed This Visit       Unprioritized   Mixed hyperlipidemia (Chronic)   Managed with diet and exercise.  Relevant Orders   Lipid panel   Mild intermittent asthma without complication (Chronic)   Doing well without recent symptoms Does not use albuterol or maintenance inhalers      History of peptic ulcer   Reflux symptoms are controlled on diet only. Patient denies red flag symptoms - no melena, weight loss, dysphagia.       Elevated blood sugar   Continue healthy low carb diet.      Relevant Orders   Comprehensive metabolic panel with GFR   Hemoglobin A1c   Cervical disc disease with myelopathy   Surgery scheduled for June.      Other Visit Diagnoses        Annual physical exam    -  Primary   Tdap today consider Prevnar 20 no other screenings due   Relevant Orders   Comprehensive metabolic panel with GFR   Hemoglobin A1c   Lipid panel   TSH     Immunization due       Relevant Orders   Tdap vaccine greater than or equal to 7yo IM (Completed)       No follow-ups on file.    Sheron Dixons, MD [facility] [place]

## 2024-04-28 NOTE — Assessment & Plan Note (Signed)
 Doing well without recent symptoms Does not use albuterol or maintenance inhalers

## 2024-04-28 NOTE — Assessment & Plan Note (Signed)
Continue healthy low carb diet

## 2024-04-29 ENCOUNTER — Ambulatory Visit: Payer: Self-pay | Admitting: Internal Medicine

## 2024-04-29 LAB — COMPREHENSIVE METABOLIC PANEL WITH GFR
ALT: 17 IU/L (ref 0–32)
AST: 15 IU/L (ref 0–40)
Albumin: 4.9 g/dL (ref 3.9–4.9)
Alkaline Phosphatase: 51 IU/L (ref 44–121)
BUN/Creatinine Ratio: 18 (ref 9–23)
BUN: 16 mg/dL (ref 6–20)
Bilirubin Total: 0.5 mg/dL (ref 0.0–1.2)
CO2: 19 mmol/L — ABNORMAL LOW (ref 20–29)
Calcium: 10.2 mg/dL (ref 8.7–10.2)
Chloride: 103 mmol/L (ref 96–106)
Creatinine, Ser: 0.87 mg/dL (ref 0.57–1.00)
Globulin, Total: 2.3 g/dL (ref 1.5–4.5)
Glucose: 83 mg/dL (ref 70–99)
Potassium: 4.1 mmol/L (ref 3.5–5.2)
Sodium: 140 mmol/L (ref 134–144)
Total Protein: 7.2 g/dL (ref 6.0–8.5)
eGFR: 87 mL/min/{1.73_m2} (ref >59)

## 2024-04-29 LAB — LIPID PANEL
Chol/HDL Ratio: 3 ratio (ref 0.0–4.4)
Cholesterol, Total: 214 mg/dL — ABNORMAL HIGH (ref 100–199)
HDL: 72 mg/dL (ref >39)
LDL Chol Calc (NIH): 128 mg/dL — ABNORMAL HIGH (ref 0–99)
Triglycerides: 82 mg/dL (ref 0–149)
VLDL Cholesterol Cal: 14 mg/dL (ref 5–40)

## 2024-04-29 LAB — HEMOGLOBIN A1C
Est. average glucose Bld gHb Est-mCnc: 94 mg/dL
Hgb A1c MFr Bld: 4.9 % (ref 4.8–5.6)

## 2024-04-29 LAB — TSH: TSH: 2.59 u[IU]/mL (ref 0.450–4.500)

## 2024-05-04 DIAGNOSIS — M5412 Radiculopathy, cervical region: Secondary | ICD-10-CM | POA: Diagnosis not present

## 2024-05-04 DIAGNOSIS — Z01818 Encounter for other preprocedural examination: Secondary | ICD-10-CM | POA: Diagnosis not present

## 2024-05-24 DIAGNOSIS — Z9889 Other specified postprocedural states: Secondary | ICD-10-CM | POA: Diagnosis not present

## 2024-05-24 DIAGNOSIS — G959 Disease of spinal cord, unspecified: Secondary | ICD-10-CM | POA: Diagnosis not present

## 2024-05-24 DIAGNOSIS — Z90710 Acquired absence of both cervix and uterus: Secondary | ICD-10-CM | POA: Diagnosis not present

## 2024-05-24 DIAGNOSIS — Z885 Allergy status to narcotic agent status: Secondary | ICD-10-CM | POA: Diagnosis not present

## 2024-05-24 DIAGNOSIS — M4802 Spinal stenosis, cervical region: Secondary | ICD-10-CM | POA: Diagnosis not present

## 2024-05-24 DIAGNOSIS — G992 Myelopathy in diseases classified elsewhere: Secondary | ICD-10-CM | POA: Diagnosis not present

## 2024-05-24 DIAGNOSIS — Z91048 Other nonmedicinal substance allergy status: Secondary | ICD-10-CM | POA: Diagnosis not present

## 2024-05-24 DIAGNOSIS — K581 Irritable bowel syndrome with constipation: Secondary | ICD-10-CM | POA: Diagnosis not present

## 2024-05-24 DIAGNOSIS — J4520 Mild intermittent asthma, uncomplicated: Secondary | ICD-10-CM | POA: Diagnosis not present

## 2024-05-24 DIAGNOSIS — E785 Hyperlipidemia, unspecified: Secondary | ICD-10-CM | POA: Diagnosis not present

## 2024-05-24 DIAGNOSIS — K219 Gastro-esophageal reflux disease without esophagitis: Secondary | ICD-10-CM | POA: Diagnosis not present

## 2024-05-24 DIAGNOSIS — Z888 Allergy status to other drugs, medicaments and biological substances status: Secondary | ICD-10-CM | POA: Diagnosis not present

## 2024-05-24 DIAGNOSIS — M5412 Radiculopathy, cervical region: Secondary | ICD-10-CM | POA: Diagnosis not present

## 2024-05-24 DIAGNOSIS — D649 Anemia, unspecified: Secondary | ICD-10-CM | POA: Diagnosis not present

## 2024-05-24 DIAGNOSIS — Z79899 Other long term (current) drug therapy: Secondary | ICD-10-CM | POA: Diagnosis not present

## 2024-06-08 DIAGNOSIS — M5412 Radiculopathy, cervical region: Secondary | ICD-10-CM | POA: Diagnosis not present

## 2024-07-08 DIAGNOSIS — M5412 Radiculopathy, cervical region: Secondary | ICD-10-CM | POA: Diagnosis not present
